# Patient Record
Sex: Male | Born: 1990 | Race: White | Hispanic: No | Marital: Single | Smoking: Never smoker
Health system: Southern US, Community
[De-identification: ages and names within clinical notes are randomized; demographics above are authoritative.]

## PROBLEM LIST (undated history)

## (undated) DIAGNOSIS — Z8619 Personal history of other infectious and parasitic diseases: Secondary | ICD-10-CM

## (undated) DIAGNOSIS — F32A Depression, unspecified: Secondary | ICD-10-CM

## (undated) DIAGNOSIS — A389 Scarlet fever, uncomplicated: Secondary | ICD-10-CM

## (undated) DIAGNOSIS — F419 Anxiety disorder, unspecified: Secondary | ICD-10-CM

## (undated) DIAGNOSIS — F845 Asperger's syndrome: Secondary | ICD-10-CM

## (undated) HISTORY — PX: HERNIA REPAIR: SHX51

---

## 2013-05-14 ENCOUNTER — Emergency Department (HOSPITAL_COMMUNITY): Payer: 59

## 2013-05-14 ENCOUNTER — Encounter (HOSPITAL_COMMUNITY): Payer: Self-pay | Admitting: Emergency Medicine

## 2013-05-14 ENCOUNTER — Emergency Department (HOSPITAL_COMMUNITY)
Admission: EM | Admit: 2013-05-14 | Discharge: 2013-05-14 | Disposition: A | Discharge status: Home / Self Care | Payer: 59 | Attending: Emergency Medicine | Admitting: Emergency Medicine

## 2013-05-14 DIAGNOSIS — F848 Other pervasive developmental disorders: Secondary | ICD-10-CM | POA: Insufficient documentation

## 2013-05-14 DIAGNOSIS — J3489 Other specified disorders of nose and nasal sinuses: Secondary | ICD-10-CM | POA: Insufficient documentation

## 2013-05-14 DIAGNOSIS — R040 Epistaxis: Secondary | ICD-10-CM | POA: Insufficient documentation

## 2013-05-14 DIAGNOSIS — S060X9A Concussion with loss of consciousness of unspecified duration, initial encounter: Secondary | ICD-10-CM | POA: Insufficient documentation

## 2013-05-14 DIAGNOSIS — Y9389 Activity, other specified: Secondary | ICD-10-CM | POA: Insufficient documentation

## 2013-05-14 DIAGNOSIS — Y92410 Unspecified street and highway as the place of occurrence of the external cause: Secondary | ICD-10-CM | POA: Insufficient documentation

## 2013-05-14 MED ORDER — IBUPROFEN 800 MG PO TABS: 800.0000 mg | ORAL_TABLET | Freq: Once | ORAL | Status: DC

## 2013-05-14 MED ORDER — IBUPROFEN 800 MG PO TABS
800.0000 mg | ORAL_TABLET | Freq: Once | ORAL | Status: AC
  Administered 2013-05-14: 800 mg via ORAL
  Filled 2013-05-14: qty 1
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
## 2013-05-14 NOTE — ED Notes (Signed)
Patient transported to X-ray 

## 2013-05-14 NOTE — ED Provider Notes (Signed)
Medical screening examination/treatment/procedure(s) were performed by non-physician practitioner and as supervising physician I was immediately available for consultation/collaboration.  Fernado Brigante R. Lorea Kupfer, MD 05/14/13 1540 

## 2013-12-04 ENCOUNTER — Encounter (HOSPITAL_COMMUNITY): Payer: Self-pay | Admitting: Emergency Medicine

## 2013-12-04 ENCOUNTER — Emergency Department (HOSPITAL_COMMUNITY): Payer: 59

## 2013-12-04 ENCOUNTER — Emergency Department (HOSPITAL_COMMUNITY)
Admission: EM | Admit: 2013-12-04 | Discharge: 2013-12-04 | Disposition: A | Discharge status: Home / Self Care | Payer: 59 | Attending: Emergency Medicine | Admitting: Emergency Medicine

## 2013-12-04 DIAGNOSIS — Y92410 Unspecified street and highway as the place of occurrence of the external cause: Secondary | ICD-10-CM | POA: Insufficient documentation

## 2013-12-04 DIAGNOSIS — R0789 Other chest pain: Secondary | ICD-10-CM

## 2013-12-04 DIAGNOSIS — Z8659 Personal history of other mental and behavioral disorders: Secondary | ICD-10-CM | POA: Insufficient documentation

## 2013-12-04 DIAGNOSIS — S335XXA Sprain of ligaments of lumbar spine, initial encounter: Secondary | ICD-10-CM | POA: Insufficient documentation

## 2013-12-04 DIAGNOSIS — Z8619 Personal history of other infectious and parasitic diseases: Secondary | ICD-10-CM | POA: Insufficient documentation

## 2013-12-04 DIAGNOSIS — S298XXA Other specified injuries of thorax, initial encounter: Secondary | ICD-10-CM | POA: Insufficient documentation

## 2013-12-04 DIAGNOSIS — Y9389 Activity, other specified: Secondary | ICD-10-CM | POA: Insufficient documentation

## 2013-12-04 DIAGNOSIS — S39012A Strain of muscle, fascia and tendon of lower back, initial encounter: Secondary | ICD-10-CM

## 2013-12-04 HISTORY — DX: Scarlet fever, uncomplicated: A38.9

## 2013-12-04 HISTORY — DX: Asperger's syndrome: F84.5

## 2013-12-04 MED ORDER — IBUPROFEN 800 MG PO TABS: 800.0000 mg | ORAL_TABLET | Freq: Three times a day (TID) | ORAL | Status: DC | PRN

## 2013-12-04 NOTE — ED Notes (Signed)
Pt states that "feels like a bubble that keeps popping in my chest."

## 2013-12-04 NOTE — ED Notes (Signed)
Pt.'s cleared off C-collar, removed.
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
## 2013-12-04 NOTE — Discharge Instructions (Signed)
Return here as needed.  Followup with his primary care Dr. for recheck.  Ice and heat on the areas that are sore

## 2013-12-04 NOTE — ED Notes (Signed)
Patient transported to X-ray 
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
## 2013-12-06 NOTE — ED Provider Notes (Signed)
Medical screening examination/treatment/procedure(s) were performed by non-physician practitioner and as supervising physician I was immediately available for consultation/collaboration.   Ericson Nafziger M Danyetta Gillham, MD 12/06/13 1334 
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
## 2014-11-22 ENCOUNTER — Encounter (HOSPITAL_COMMUNITY): Payer: Self-pay | Admitting: Emergency Medicine

## 2014-11-22 ENCOUNTER — Emergency Department (HOSPITAL_COMMUNITY)
Admission: EM | Admit: 2014-11-22 | Discharge: 2014-11-23 | Disposition: A | Discharge status: Home / Self Care | Payer: 59 | Attending: Emergency Medicine | Admitting: Emergency Medicine

## 2014-11-22 DIAGNOSIS — Z880 Allergy status to penicillin: Secondary | ICD-10-CM | POA: Insufficient documentation

## 2014-11-22 DIAGNOSIS — R45851 Suicidal ideations: Secondary | ICD-10-CM | POA: Insufficient documentation

## 2014-11-22 DIAGNOSIS — Z8619 Personal history of other infectious and parasitic diseases: Secondary | ICD-10-CM | POA: Insufficient documentation

## 2014-11-22 DIAGNOSIS — Z791 Long term (current) use of non-steroidal anti-inflammatories (NSAID): Secondary | ICD-10-CM | POA: Diagnosis not present

## 2014-11-22 DIAGNOSIS — K5900 Constipation, unspecified: Secondary | ICD-10-CM | POA: Insufficient documentation

## 2014-11-22 DIAGNOSIS — R000 Tachycardia, unspecified: Secondary | ICD-10-CM | POA: Insufficient documentation

## 2014-11-22 DIAGNOSIS — Z79899 Other long term (current) drug therapy: Secondary | ICD-10-CM | POA: Diagnosis not present

## 2014-11-22 LAB — ACETAMINOPHEN LEVEL

## 2014-11-22 LAB — CBC
HCT: 45 % (ref 39.0–52.0)
HEMOGLOBIN: 15.5 g/dL (ref 13.0–17.0)
MCH: 29.5 pg (ref 26.0–34.0)
MCHC: 34.4 g/dL (ref 30.0–36.0)
MCV: 85.6 fL (ref 78.0–100.0)
Platelets: 229 10*3/uL (ref 150–400)
RBC: 5.26 MIL/uL (ref 4.22–5.81)
RDW: 12.3 % (ref 11.5–15.5)
WBC: 12.7 10*3/uL — ABNORMAL HIGH (ref 4.0–10.5)

## 2014-11-22 LAB — COMPREHENSIVE METABOLIC PANEL
ALT: 20 U/L (ref 0–53)
AST: 31 U/L (ref 0–37)
Albumin: 5.2 g/dL (ref 3.5–5.2)
Alkaline Phosphatase: 66 U/L (ref 39–117)
Anion gap: 12 (ref 5–15)
BUN: 15 mg/dL (ref 6–23)
CALCIUM: 10.1 mg/dL (ref 8.4–10.5)
CO2: 22 mmol/L (ref 19–32)
Chloride: 102 mmol/L (ref 96–112)
Creatinine, Ser: 0.76 mg/dL (ref 0.50–1.35)
Glucose, Bld: 88 mg/dL (ref 70–99)
Potassium: 3.5 mmol/L (ref 3.5–5.1)
Sodium: 136 mmol/L (ref 135–145)
TOTAL PROTEIN: 7.9 g/dL (ref 6.0–8.3)
Total Bilirubin: 3.1 mg/dL — ABNORMAL HIGH (ref 0.3–1.2)

## 2014-11-22 LAB — ETHANOL: Alcohol, Ethyl (B): <5 mg/dL (ref 0–9)

## 2014-11-22 LAB — RAPID URINE DRUG SCREEN, HOSP PERFORMED
AMPHETAMINES: POSITIVE — AB
Barbiturates: NOT DETECTED
Benzodiazepines: NOT DETECTED
COCAINE: NOT DETECTED
Opiates: NOT DETECTED
Tetrahydrocannabinol: NOT DETECTED

## 2014-11-22 LAB — SALICYLATE LEVEL: Salicylate Lvl: <4 mg/dL (ref 2.8–20.0)

## 2014-11-22 MED ORDER — NICOTINE 21 MG/24HR TD PT24: 21.0000 mg | MEDICATED_PATCH | Freq: Every day | TRANSDERMAL | Status: DC

## 2014-11-22 MED ORDER — CITALOPRAM HYDROBROMIDE 10 MG PO TABS
10.0000 mg | ORAL_TABLET | Freq: Every day | ORAL | Status: DC
  Administered 2014-11-22: 10 mg via ORAL
  Filled 2014-11-22 (×2): qty 1

## 2014-11-22 MED ORDER — ZOLPIDEM TARTRATE 5 MG PO TABS: 5.0000 mg | ORAL_TABLET | Freq: Every evening | ORAL | Status: DC | PRN

## 2014-11-22 MED ORDER — IBUPROFEN 200 MG PO TABS: 600.0000 mg | ORAL_TABLET | Freq: Three times a day (TID) | ORAL | Status: DC | PRN

## 2014-11-22 MED ORDER — LISDEXAMFETAMINE DIMESYLATE 30 MG PO CAPS
30.0000 mg | ORAL_CAPSULE | Freq: Every day | ORAL | Status: DC
  Administered 2014-11-23: 30 mg via ORAL
  Filled 2014-11-22 (×2): qty 1

## 2014-11-22 MED ORDER — ONDANSETRON HCL 4 MG PO TABS: 4.0000 mg | ORAL_TABLET | Freq: Three times a day (TID) | ORAL | Status: DC | PRN

## 2014-11-22 MED ORDER — LORAZEPAM 1 MG PO TABS
1.0000 mg | ORAL_TABLET | Freq: Three times a day (TID) | ORAL | Status: DC | PRN
  Administered 2014-11-22: 1 mg via ORAL
  Filled 2014-11-22: qty 1

## 2014-11-22 MED ORDER — ACETAMINOPHEN 325 MG PO TABS: 650.0000 mg | ORAL_TABLET | ORAL | Status: DC | PRN

## 2014-11-22 MED ORDER — ALUM & MAG HYDROXIDE-SIMETH 200-200-20 MG/5ML PO SUSP: 30.0000 mL | ORAL | Status: DC | PRN

## 2014-11-22 NOTE — ED Notes (Signed)
Mother at bedside, pt not wanting to talk right now, pt resting in bed curled up in fetal position. Mother states pt recently lost job of 3 years and she states she thinks this is what triggered his thoughts and anxiety.

## 2014-11-22 NOTE — ED Notes (Signed)
Patient wanded by security. All belongings given to parents and taken to car.

## 2014-11-22 NOTE — ED Notes (Signed)
TTS at bedside w/ pt and parents, pt given sandwich to eat.
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
## 2014-11-22 NOTE — ED Notes (Signed)
Pt states that he feels like he wants to kill himself. Hx of aspberger's disorder. Lost his job recently. States he has several ways he has thought of killing himself but did not list any. Pt seems very anxious at present.

## 2014-11-22 NOTE — BH Assessment (Addendum)
Tele Assessment Note   Fernando BillsBrant Todd is an 24 y.o. male.  -Clinician reviewed note by Junius FinnerErin O'malley, PA regarding need for TTS.  Pt was seen today by his counselor Fernando Todd.  During the session patient wrote on a note that he was having thoughts of hurting himself.  Patient says that he has been thinking of hurting himself for a long time.  Pt has dx of Aspberger's Syndrome and does understand concept of ending one's life.  Patient says he is not sure he could be safe if he goes home.  No previous attempts.  Patient does not have a plan other than "I would go off somewhere and die."  Patient is unable to contract for safety at this time.  Patient started to see Dr. Ellis SavageLisa Todd for psychiatry in January.  Patient was recently put on citalopram at night for his mood.  Takes vivance in AM.  His workplace of 3 years had suggested medication to assist him with mood and regularity.  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  Patient also complains of stomach upset for last three days and he has not had regular bowel functions since then.    -Pt care reviewed with [name], PA who recommends that if patient cannot contract for safety, we need to look for placement.  [facility] does not have programming for persons w/ Asperger's Syndrome.  He suggested looking for other placement.  Patient care discussed with Dr. [name].    Axis I: Asperger's Disorder and Generalized Anxiety Disorder Axis II: Deferred Axis III:  Past Medical History  Diagnosis Date  . Asperger's disorder   . Scarlet fever    Axis IV: economic problems, occupational problems and other psychosocial or environmental problems Axis V: 31-40 impairment in reality testing  Past Medical History:  Past Medical History  Diagnosis Date  . Asperger's disorder   . Scarlet fever     Past Surgical History  Procedure Laterality Date  . Hernia repair      Family History: History reviewed. No  pertinent family history.  Social History:  reports that he has never smoked. He has never used smokeless tobacco. He reports that he does not drink alcohol or use illicit drugs.  Additional Social History:  Alcohol / Drug Use Pain Medications: See PTA medication list Prescriptions: Vyvanse, Citralopram Over the Counter: N/a History of alcohol / drug use?: No history of alcohol / drug abuse  CIWA: CIWA-Ar BP: 124/79 mmHg Pulse Rate: 104 COWS:    PATIENT STRENGTHS: (choose at least two) Communication skills Supportive family/friends Work skills  Allergies:  Allergies  Allergen Reactions  . Amoxicillin Hives    Home Medications:  (Not in a hospital admission)  OB/GYN Status:  No LMP for male patient.  General Assessment Data Location of Assessment: [facility] Is this a Tele or Face-to-Face Assessment?: Face-to-Face Is this an Initial Assessment or a Re-assessment for this encounter?: Initial Assessment Living Arrangements: Parent (Lives w/ parents and younger brother.) Can pt return to current living arrangement?: Yes Admission Status: Voluntary Is patient capable of signing voluntary admission?: Yes Transfer from: Acute Hospital Referral Source: Psychiatrist     [facility] Living Arrangements: Parent (Lives w/ parents and younger brother.) Name of Psychiatrist: Dr. Lance CoonPoulos  Name of Therapist: Vickie Conners w/ [facility]  Education Status Is patient currently in school?: No Highest grade of school patient has completed: 12th grade  Risk  to self with the past 6 months Suicidal Ideation: Yes-Currently Present Suicidal Intent: Yes-Currently Present (Pt responds, "Maybe" when asked if he may hurt himself.) Is patient at risk for suicide?: Yes Suicidal Plan?: No Access to Means: No What has been your use of drugs/alcohol within the last 12 months?: NOne Previous Attempts/Gestures: No How many times?: 0 Other Self Harm Risks: None Triggers for  Past Attempts: None known Intentional Self Injurious Behavior: None Family Suicide History: Yes (Maternal great grandfather) Recent stressful life event(s): Recent negative physical changes, Job Loss (Lost job 10 days ago.  Stomach upset for last 3 days  ) Persecutory voices/beliefs?: Yes Depression: Yes Depression Symptoms: Despondent, Tearfulness, Isolating, Loss of interest in usual pleasures, Feeling worthless/self pity Substance abuse history and/or treatment for substance abuse?: No Suicide prevention information given to non-admitted patients: Not applicable  Risk to Others within the past 6 months Homicidal Ideation: No Thoughts of Harm to Others: No Current Homicidal Intent: No Current Homicidal Plan: No Access to Homicidal Means: No Identified Victim: No one History of harm to others?: No Assessment of Violence: None Noted Violent Behavior Description: Pt calm and cooperative. Does patient have access to weapons?: No Criminal Charges Pending?: No Does patient have a court date: No  Psychosis Hallucinations: None noted Delusions: None noted  Mental Status Report Appear/Hygiene: Disheveled, In scrubs Eye Contact: Poor Motor Activity: Psychomotor retardation, Rigidity (Stiffness of movement in last 2 days.) Speech: Soft, Slow, Logical/coherent Level of Consciousness: Alert Mood: Depressed, Helpless, Sad, Despair Affect: Anxious, Depressed, Sad Anxiety Level: Severe Thought Processes: Coherent, Relevant Judgement: Unimpaired Orientation: Person, Place, Time, Appropriate for developmental age, Situation Obsessive Compulsive Thoughts/Behaviors: Moderate  Cognitive Functioning Memory: Recent Impaired, Remote Intact (Pt thinks his memory is getting worse.) IQ: Average (Pt has dx of Aspberger's Syndrome.) Impulse Control: Fair Appetite: Poor Weight Loss:  (Some stomach upset over last few days.) Weight Gain: 0 Sleep: No Change Total Hours of Sleep: 8 Vegetative  Symptoms: Staying in bed (Verbal reminders to attend to hygine needs.)  ADLScreening [facility]) Patient's cognitive ability adequate to safely complete daily activities?: Yes Patient able to express need for assistance with ADLs?: Yes Independently performs ADLs?: Yes (appropriate for developmental age)  Prior Inpatient Therapy Prior Inpatient Therapy: No Prior Therapy Dates: None Prior Therapy Facilty/Provider(s): None Reason for Treatment: None  Prior Outpatient Therapy Prior Outpatient Therapy: Yes Prior Therapy Dates: November '15 / January '16 Prior Therapy Facilty/Provider(s): Therapist / psychiatrist.  Reason for Treatment: Med management and counseling  ADL Screening (condition at time of admission) Patient's cognitive ability adequate to safely complete daily activities?: Yes Is the patient deaf or have difficulty hearing?: No Does the patient have difficulty seeing, even when wearing glasses/contacts?: No Does the patient have difficulty concentrating, remembering, or making decisions?: Yes Patient able to express need for assistance with ADLs?: Yes Does the patient have difficulty dressing or bathing?: Yes (Reminders for hygene.) Independently performs ADLs?: Yes (appropriate for developmental age) Does the patient have difficulty walking or climbing stairs?: No (Some difficulty in last 3-4 days.) Weakness of Legs: None Weakness of Arms/Hands: None       Abuse/Neglect Assessment (Assessment to be complete while patient is alone) Physical Abuse: Denies Verbal Abuse: Yes, past (Comment) (Middle & high school bullying.) Sexual Abuse: Denies Exploitation of patient/patient's resources: Denies Self-Neglect: Denies     Merchant navy officer (For Healthcare) Does patient have an advance directive?: No Would patient like information on creating an advanced directive?: No - patient declined  information    Additional Information 1:1 In Past 12 Months?:  No CIRT Risk: No Elopement Risk: No Does patient have medical clearance?: Yes     Disposition:  Disposition Initial Assessment Completed for this Encounter: Yes Disposition of Patient: Other dispositions Other disposition(s): Other (Comment) (Review with PA.)  Alexandria Lodge 11/22/2014 9:46 PM

## 2014-11-23 ENCOUNTER — Inpatient Hospital Stay (HOSPITAL_COMMUNITY): Admit: 2014-11-23 | Payer: Self-pay | Admitting: Psychiatry

## 2014-11-23 ENCOUNTER — Encounter (HOSPITAL_COMMUNITY): Payer: Self-pay | Admitting: *Deleted

## 2014-11-23 ENCOUNTER — Inpatient Hospital Stay (HOSPITAL_COMMUNITY)
Admission: AD | Admit: 2014-11-23 | Discharge: 2014-11-29 | DRG: 880 | Disposition: A | Discharge status: Home / Self Care | Payer: 59 | Source: Intra-hospital | Attending: Psychiatry | Admitting: Psychiatry

## 2014-11-23 DIAGNOSIS — F411 Generalized anxiety disorder: Principal | ICD-10-CM | POA: Diagnosis present

## 2014-11-23 DIAGNOSIS — F845 Asperger's syndrome: Secondary | ICD-10-CM

## 2014-11-23 DIAGNOSIS — R17 Unspecified jaundice: Secondary | ICD-10-CM | POA: Insufficient documentation

## 2014-11-23 DIAGNOSIS — R45851 Suicidal ideations: Secondary | ICD-10-CM | POA: Diagnosis not present

## 2014-11-23 DIAGNOSIS — F909 Attention-deficit hyperactivity disorder, unspecified type: Secondary | ICD-10-CM | POA: Diagnosis present

## 2014-11-23 DIAGNOSIS — Z79899 Other long term (current) drug therapy: Secondary | ICD-10-CM | POA: Diagnosis not present

## 2014-11-23 DIAGNOSIS — F908 Attention-deficit hyperactivity disorder, other type: Secondary | ICD-10-CM | POA: Insufficient documentation

## 2014-11-23 DIAGNOSIS — F329 Major depressive disorder, single episode, unspecified: Secondary | ICD-10-CM | POA: Diagnosis present

## 2014-11-23 DIAGNOSIS — F32A Depression, unspecified: Secondary | ICD-10-CM | POA: Diagnosis present

## 2014-11-23 DIAGNOSIS — F332 Major depressive disorder, recurrent severe without psychotic features: Secondary | ICD-10-CM | POA: Diagnosis present

## 2014-11-23 HISTORY — DX: Personal history of other infectious and parasitic diseases: Z86.19

## 2014-11-23 MED ORDER — MAGNESIUM HYDROXIDE 400 MG/5ML PO SUSP
30.0000 mL | Freq: Every day | ORAL | Status: DC | PRN
  Administered 2014-11-24: 30 mL via ORAL
  Filled 2014-11-23: qty 30

## 2014-11-23 MED ORDER — HYDROXYZINE HCL 25 MG PO TABS: 25.0000 mg | ORAL_TABLET | Freq: Four times a day (QID) | ORAL | Status: DC | PRN

## 2014-11-23 MED ORDER — TRAZODONE HCL 50 MG PO TABS
50.0000 mg | ORAL_TABLET | Freq: Every evening | ORAL | Status: DC | PRN
  Administered 2014-11-23 – 2014-11-25 (×3): 50 mg via ORAL
  Filled 2014-11-23 (×10): qty 1
  Filled 2014-11-23: qty 6
  Filled 2014-11-23 (×5): qty 1

## 2014-11-23 MED ORDER — ACETAMINOPHEN 325 MG PO TABS: 650.0000 mg | ORAL_TABLET | Freq: Four times a day (QID) | ORAL | Status: DC | PRN

## 2014-11-23 MED ORDER — CITALOPRAM HYDROBROMIDE 10 MG PO TABS
10.0000 mg | ORAL_TABLET | Freq: Every day | ORAL | Status: DC
  Administered 2014-11-23: 10 mg via ORAL
  Filled 2014-11-23 (×3): qty 1

## 2014-11-23 MED ORDER — ALUM & MAG HYDROXIDE-SIMETH 200-200-20 MG/5ML PO SUSP: 30.0000 mL | ORAL | Status: DC | PRN

## 2014-11-23 MED ORDER — LISDEXAMFETAMINE DIMESYLATE 30 MG PO CAPS
30.0000 mg | ORAL_CAPSULE | Freq: Every day | ORAL | Status: DC
  Administered 2014-11-24 – 2014-11-29 (×6): 30 mg via ORAL
  Filled 2014-11-23 (×6): qty 1

## 2014-11-23 NOTE — ED Notes (Signed)
Pt provided lunch meal. Pt sitting up in bed eating.

## 2014-11-23 NOTE — Consult Note (Signed)
[facility]to[facility] Consult   Reason for Consult:  Suicidal Ideation Referring Physician:  EDP Patient Identification: Fernando BillsBrant Bifulco MRN:  [id number] Principal Diagnosis: <principal problem not specified> Diagnosis:   Patient Active Problem List   Diagnosis Date Noted  . Suicidal ideation [R45.851]     Total Time spent with patient: 25 minutes  Subjective:   Fernando BillsBrant Ordoyne is a 24 y.o. male patient admitted with reports of suicidal ideation with rumination/obsession about self-harm. Pt seen and chart reviewed this AM by Dahlia ByesJosephine Onuoha, NP. Pt reported to meet inpatient criteria with placement pending.   HPI:  Fernando BillsBrant Hanssen is an 24 y.o. male.  -Clinician reviewed note by Junius FinnerErin O'malley, PA regarding need for TTS. Pt was seen today by his counselor [name]. During the session patient wrote on a note that he was having thoughts of hurting himself.  Patient says that he has been thinking of hurting himself for a long time. Pt has dx of Aspberger's Syndrome and does understand concept of ending one's life. Patient says he is not sure he could be safe if he goes home. No previous attempts. Patient does not have a plan other than "I would go off somewhere and die." Patient is unable to contract for safety at this time.  Patient started to see Dr. Ellis SavageLisa Poulos for psychiatry in January. Patient was recently put on citalopram at night for his mood. Takes vivance in AM. His workplace of 3 years had suggested medication to assist him with mood and regularity. 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 Patient also complains of stomach upset for last three days and he has not had regular bowel functions since then.   -Pt care reviewed with [name], PA who recommends that if patient cannot contract for safety, we need to look for placement. [facility] does not have programming for persons w/ Asperger's Syndrome. He suggested looking for  other placement. Patient care discussed with Dr. [name].  HPI Elements:   Location:  Psychiatric. Quality:  Worsening. Severity:  Severe. Timing:  Constant. Duration:  Persistent. 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  Past Medical History:  Past Medical History  Diagnosis Date  . Asperger's disorder   . Scarlet fever     Past Surgical History  Procedure Laterality Date  . Hernia repair     Family History: History reviewed. No pertinent family history. Social History:  History  Alcohol Use No     History  Drug Use No    History   Social History  . Marital Status: Single    Spouse Name: N/A  . Number of Children: N/A  . Years of Education: N/A   Social History Main Topics  . Smoking status: Never Smoker   . Smokeless tobacco: Never Used  . Alcohol Use: No  . Drug Use: No  . Sexual Activity: Not on file   Other Topics Concern  . Todd   Social History Narrative   Additional Social History:    Pain Medications: See PTA medication list Prescriptions: Vyvanse, Citralopram Over the Counter: N/a History of alcohol / drug use?: No history of alcohol / drug abuse                     Allergies:   Allergies  Allergen Reactions  . Amoxicillin Hives    Vitals: Blood pressure 117/77, pulse 91, temperature 99.2 F (37.3 C), temperature source Oral, resp.  rate 16, SpO2 96 %.  Risk to Self: Suicidal Ideation: Yes-Currently Present Suicidal Intent: Yes-Currently Present (Pt responds, "Maybe" when asked if he may hurt himself.) Is patient at risk for suicide?: Yes Suicidal Plan?: No Access to Means: No What has been your use of drugs/alcohol within the last 12 months?: Todd How many times?: 0 Other Self Harm Risks: Todd Triggers for Past Attempts: Todd known Intentional Self Injurious Behavior: Todd Risk to Others: Homicidal Ideation: No Thoughts of Harm to Others: No Current Homicidal Intent: No Current Homicidal Plan:  No Access to Homicidal Means: No Identified Victim: No one History of harm to others?: No Assessment of Violence: Todd Noted Violent Behavior Description: Pt calm and cooperative. Does patient have access to weapons?: No Criminal Charges Pending?: No Does patient have a court date: No Prior Inpatient Therapy: Prior Inpatient Therapy: No Prior Therapy Dates: Todd Prior Therapy Facilty/Provider(s): Todd Reason for Treatment: Todd Prior Outpatient Therapy: Prior Outpatient Therapy: Yes Prior Therapy Dates: November '15 / January '16 Prior Therapy Facilty/Provider(s): Therapist / psychiatrist.  Reason for Treatment: Med management and counseling  Current Facility-Administered Medications  Medication Dose Route Frequency Provider Last Rate Last Dose  . acetaminophen (TYLENOL) tablet 650 mg  650 mg Oral Q4H PRN Junius Finner, PA-C      . alum & mag hydroxide-simeth (MAALOX/MYLANTA) 200-200-20 MG/5ML suspension 30 mL  30 mL Oral PRN Junius Finner, PA-C      . citalopram (CELEXA) tablet 10 mg  10 mg Oral QHS Junius Finner, PA-C   10 mg at 11/22/14 2108  . ibuprofen (ADVIL,MOTRIN) tablet 600 mg  600 mg Oral Q8H PRN Junius Finner, PA-C      . lisdexamfetamine (VYVANSE) capsule 30 mg  30 mg Oral Daily Junius Finner, PA-C   30 mg at 11/23/14 1005  . LORazepam (ATIVAN) tablet 1 mg  1 mg Oral Q8H PRN Junius Finner, PA-C   1 mg at 11/22/14 2011  . nicotine (NICODERM CQ - dosed in mg/24 hours) patch 21 mg  21 mg Transdermal Daily Junius Finner, PA-C   21 mg at 11/22/14 2024  . ondansetron (ZOFRAN) tablet 4 mg  4 mg Oral Q8H PRN Junius Finner, PA-C      . zolpidem (AMBIEN) tablet 5 mg  5 mg Oral QHS PRN Junius Finner, PA-C       Current Outpatient Prescriptions  Medication Sig Dispense Refill  . citalopram (CELEXA) 10 MG tablet Take 10 mg by mouth at bedtime.  0  . VYVANSE 30 MG capsule Take 30 mg by mouth daily.  0  . ibuprofen (ADVIL,MOTRIN) 800 MG tablet Take 1 tablet (800 mg total) by mouth  every 8 (eight) hours as needed. (Patient not taking: Reported on 11/22/2014) 21 tablet 0    Musculoskeletal: Strength & Muscle Tone: within normal limits Gait & Station: normal Patient leans: N/A  Psychiatric Specialty Exam:     Blood pressure 117/77, pulse 91, temperature 99.2 F (37.3 C), temperature source Oral, resp. rate 16, SpO2 96 %.There is no height or weight on file to calculate BMI.  General Appearance: Casual  Eye Contact::  Poor  Speech:  Clear and Coherent  Volume:  Normal  Mood:  Depressed  Affect:  Depressed and Flat  Thought Process:  Circumstantial and Coherent  Orientation:  Full (Time, Place, and Person)  Thought Content:  Obsessions and Rumination  Suicidal Thoughts:  Yes.  with intent/plan  Homicidal Thoughts:  No  Memory:  Immediate;   Good Recent;  Good Remote;   Good  Judgement:  Fair  Insight:  Fair  Psychomotor Activity:  Normal  Concentration:  Good  Recall:  Fair  Fund of Knowledge:Good  Language: Good  Akathisia:  No  Handed:    AIMS (if indicated):     Assets:  Communication Skills Physical Health Resilience  ADL's:  Intact  Cognition: WNL  Sleep:      Medical Decision Making: Review of Psycho-Social Stressors (1), Review or order clinical lab tests (1) and Established Problem, Worsening (2)  Treatment Plan Summary: See below  Plan:  Recommend psychiatric Inpatient admission when medically cleared.  Disposition: Seek inpatient placement.   UPDATE:   Pt accepted to [facility] 403-2.   Beau Fanny, FNP-BC 11/23/2014 3:01 PM  Patient seen face-to-face for psychiatric evaluation, chart reviewed and case discussed with the physician extender and developed treatment plan. Reviewed the information documented and agree with the treatment plan. Thedore Mins, MD

## 2014-11-23 NOTE — Progress Notes (Signed)
Vol admit, 24 yo caucasian male, after being seen by his counselor today was referred to the ED d/t anxiety and suicidal thoughts.  He told her that he had been thinking about hurting himself for a long time.  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  He has Asperger's syndrome, but is high functioning.  He denies any major medical issues.  He denies any substance abuse issues.  He wants help to find the right medications to deal with his feelings and anxiety.  He was pleasant/cooperative with a pleasant sense of humor during the admission process.  He signed paperwork and was oriented to unit and room.  Safety checks q15 minutes initiated.

## 2014-11-23 NOTE — BH Assessment (Signed)
[facility] Assessment Progress Note  Per Thedore MinsMojeed Akintayo, MD this pt requires psychiatric hospitalization. Thurman CoyerEric Kaplan, RN, [facility] Rm 403-2. Pt has signed Voluntary Admission and Consent for Treatment, as well as Consent to Release Information, and signed [facility]. Pt's nurse, has been informed. She agrees to send original paperwork along with pt via Juel [place]row[place], and to call report to [phone].  Doylene Canninghomas Dainelle Hun, MA Triage Specialist 11/23/2014 @ 15:27

## 2014-11-23 NOTE — BH Assessment (Signed)
[facility] Dr. Jannifer FranklinAkintayo and Julieanne CottonJosephine, NP. Per AC-Eric Kaplin, patient bed assignment is 403-2. Nursing report # [phone].

## 2014-11-23 NOTE — Tx Team (Signed)
Initial Interdisciplinary Treatment Plan   PATIENT STRESSORS: Financial difficulties Health problems Occupational concerns Traumatic event   PATIENT STRENGTHS: Ability for insight Active sense of humor General fund of knowledge Motivation for treatment/growth Religious Affiliation Supportive family/friends   PROBLEM LIST: Problem List/Patient Goals Date to be addressed Date deferred Reason deferred Estimated date of resolution  Anxiety      Lost his job 10 days ago causing suicidal thoughts      Asperger's syndrome(high functioning)      Risk for self harm      "I lost my job because it was becoming too hard"      "I want to learn how to deal with my feelings"      "I need help to get more sleep at night"                   DISCHARGE CRITERIA:  Ability to meet basic life and health needs Adequate post-discharge living arrangements Improved stabilization in mood, thinking, and/or behavior Motivation to continue treatment in a less acute level of care Need for constant or close observation no longer present Verbal commitment to aftercare and medication compliance  PRELIMINARY DISCHARGE PLAN: Attend aftercare/continuing care group Outpatient therapy Return to previous living arrangement Return to previous work or school arrangements  PATIENT/FAMIILY INVOLVEMENT: This treatment plan has been presented to and reviewed with the patient, Fernando Todd, and/or family member.  The patient and family have been given the opportunity to ask questions and make suggestions.  Jesus GeneraSpeagle, Emit Kuenzel [facility] 11/23/2014, 10:42 PM

## 2014-11-23 NOTE — Progress Notes (Signed)
Patient ID: Fernando Todd, male   DOB: 1991-02-05, 24 y.o.   MRN: [id number] 24 yo who lost his job of 3 years who is depressed and SI.Marland Kitchen. Stated he has several ways to do it. Has a h/o Asperger (high function) and scarlet fever . He affect is flat poor to no eye contact will just stare. He reports being bullied through out school and sometimes something will remind him. He lost his job of 3 years  Two weeks ago. He denies SI  attempts now.  Denies drug use. Doesn't smoke or use alcohol. Has no tattoo's or scares on body He has a red rash on face.  Brought to unit and given  a meal .

## 2014-11-23 NOTE — ED Notes (Signed)
Pt's family member (sister) at bedside talking with pt.

## 2014-11-24 ENCOUNTER — Encounter (HOSPITAL_COMMUNITY): Payer: Self-pay | Admitting: Psychiatry

## 2014-11-24 DIAGNOSIS — F909 Attention-deficit hyperactivity disorder, unspecified type: Secondary | ICD-10-CM | POA: Diagnosis present

## 2014-11-24 DIAGNOSIS — F908 Attention-deficit hyperactivity disorder, other type: Secondary | ICD-10-CM

## 2014-11-24 DIAGNOSIS — F329 Major depressive disorder, single episode, unspecified: Secondary | ICD-10-CM | POA: Diagnosis present

## 2014-11-24 DIAGNOSIS — F845 Asperger's syndrome: Secondary | ICD-10-CM | POA: Diagnosis present

## 2014-11-24 DIAGNOSIS — F411 Generalized anxiety disorder: Secondary | ICD-10-CM | POA: Diagnosis present

## 2014-11-24 DIAGNOSIS — F32A Depression, unspecified: Secondary | ICD-10-CM | POA: Diagnosis present

## 2014-11-24 MED ORDER — CITALOPRAM HYDROBROMIDE 20 MG PO TABS
20.0000 mg | ORAL_TABLET | Freq: Every day | ORAL | Status: DC
  Administered 2014-11-24 – 2014-11-29 (×6): 20 mg via ORAL
  Filled 2014-11-24 (×9): qty 1

## 2014-11-24 MED ORDER — HYDROXYZINE HCL 25 MG PO TABS
25.0000 mg | ORAL_TABLET | Freq: Three times a day (TID) | ORAL | Status: DC
  Administered 2014-11-24 – 2014-11-29 (×16): 25 mg via ORAL
  Filled 2014-11-24 (×19): qty 1

## 2014-11-24 NOTE — [facility] Group Notes (Signed)
[facility] LCSW Group Therapy  Emotional Regulation 1:15 - 2: 30 PM        11/24/2014  3:10 PM    Type of Therapy:  Group Therapy  Participation Level:  Appropriate  Participation Quality:  Appropriate  Affect:  Flat, Depressed  Cognitive:  Attentive Appropriate  Insight:  Developing/Improving   Engagement in Therapy:  Developing/Improving   Modes of Intervention:  Discussion Exploration Problem-Solving Supportive  Summary of Progress/Problems:  Group topic was emotional regulations.  Patient participated in the discussion and was able to identify an emotion that needed to regulated.  He shared the emotion he has to regulate is fear and rejection.  Patient has difficulty expressing himself but often nodded in agreement with other patients.    Wynn BankerHodnett, Amalee Olsen Hairston 11/24/2014 3:10 PM

## 2014-11-24 NOTE — [facility] Suicide Risk Assessment (Signed)
[facility]   Nursing information obtained from:  Patient Demographic factors:  Male, Adolescent or young adult, Caucasian Current Mental Status:  NA (SI prior to admission) Loss Factors:  Decrease in vocational status Historical Factors:  Prior suicide attempts Risk Reduction Factors:  Sense of responsibility to family, Religious beliefs about death, Living with another person, especially a relative, Positive social support, Positive therapeutic relationship Total Time spent with patient: 30 minutes Principal Problem: Generalized anxiety disorder Diagnosis:   Patient Active Problem List   Diagnosis Date Noted  . Asperger's syndrome [F84.5] 11/24/2014  . ADHD (attention deficit hyperactivity disorder) [F90.9] 11/24/2014  . Generalized anxiety disorder [F41.1] 11/24/2014  . Depressive disorder [F32.9] 11/24/2014     Continued Clinical Symptoms:  Alcohol Use Disorder Identification Test Final Score (AUDIT): 0 The "Alcohol Use Disorders Identification Test", Guidelines for Use in Primary Care, Second Edition.  World Science writerHealth Organization [facility](WHO). Score between 0-7:  no or low risk or alcohol related problems. Score between 8-15:  moderate risk of alcohol related problems. Score between 16-19:  high risk of alcohol related problems. Score 20 or above:  warrants further diagnostic evaluation for alcohol dependence and treatment.   CLINICAL FACTORS:   Severe Anxiety and/or Agitation Depression:   Hopelessness Previous Psychiatric Diagnoses and Treatments   Musculoskeletal: Strength & Muscle Tone: within normal limits Gait & Station: normal Patient leans: N/A  Psychiatric Specialty Exam: Physical Exam  ROS  Blood pressure 122/71, pulse 88, temperature 97.6 F (36.4 C), temperature source Oral, resp. rate 20, height 6' (1.829 m), weight 56.7 kg (125 lb), SpO2 100 %.Body mass index is 16.95 kg/(m^2).  Please see H&P.     SUICIDE RISK:   Moderate:   Frequent suicidal ideation with limited intensity, and duration, some specificity in terms of plans, no associated intent, good self-control, limited dysphoria/symptomatology, some risk factors present, and identifiable protective factors, including available and accessible social support.  PLAN OF CARE: Please see H&P.   Medical Decision Making:  Review of Psycho-Social Stressors (1), Review or order clinical lab tests (1), Review and summation of old records (2), Established Problem, Worsening (2), Review of Medication Regimen & Side Effects (2) and Review of New Medication or Change in Dosage (2)  I certify that inpatient services furnished can reasonably be expected to improve the patient's condition.   Cristian Grieves md 11/24/2014, 3:27 PM

## 2014-11-24 NOTE — Progress Notes (Addendum)
Patient ID: Fernando Todd, male   DOB: 11/06/1990, 24 y.o.   MRN: [id number]  Pt currently presents with a flat affect and depressed behavior. Per self inventory, pt rates depression at a 3, hopelessness 0 and anxiety 3. Pt's daily goal is to "beginning my recovery" and they intend to do so by "follow schedule." Pt reports good sleep, concentration and appetite.   Pt provided with medications per providers orders. Pt's labs and vitals were monitored throughout the day. Pt supported emotionally and encouraged to express concerns and questions. Pt educated on medications and alternative stress management techniques.  Pt's safety ensured with 15 minute and environmental checks. Pt currently denies SI/HI and A/V hallucinations. Pt verbally agrees to seek staff if SI/HI or A/VH occurs and to consult with staff before acting on these thoughts. Pt avoids eye contact but has moments of intense staring at Clinical research associatewriter. Pt tells socially appropriate jokes, stating "well, bottoms up" while taking milk of magnesia for mild constipation.

## 2014-11-24 NOTE — [facility] Counselor (Signed)
Adult Comprehensive Assessment  Patient ID: Fernando Todd Carattini, male   DOB: June 26, 1991, 24 y.o.   MRN: [id number]  Information Source: Information source: Patient  Current Stressors:  Educational / Learning stressors: None Employment / Job issues: Patient lost his job two weeks ago Family Relationships: Parents are not getting along and this is causing a lot of discomfort in the home Financial / Lack of resources (include bankruptcy): None Housing / Lack of housing: None Physical health (include injuries & life threatening diseases): None Social relationships: None Substance abuse: None Bereavement / Loss: None  Living/Environment/Situation:  Living Arrangements: Parent Living conditions (as described by patient or guardian): Comfortable What is atmosphere in current home: Comfortable  Family History:  Marital status: Single Does patient have children?: No  Childhood History:  By whom was/is the patient raised?: Both parents Additional childhood history information: Good childhood until 7th grade when he was bullied by peers Description of patient's relationship with caregiver when they were a child: Good Patient's description of current relationship with people who raised him/her: Strained due to parents not getting along with each other Does patient have siblings?: Yes Number of Siblings: 2 Description of patient's current relationship with siblings: Good Did patient suffer any verbal/emotional/physical/sexual abuse as a child?: No Did patient suffer from severe childhood neglect?: No Has patient ever been sexually abused/assaulted/raped as an adolescent or adult?: No Was the patient ever a victim of a crime or a disaster?: No Witnessed domestic violence?: No Has patient been effected by domestic violence as an adult?: No  Education:  Currently a Consulting civil engineerstudent?: No  Employment/Work Situation:   Employment situation: On disability Why is patient on disability: Mental health How long  has patient been on disability: Several years Patient's job has been impacted by current illness: No What is the longest time patient has a held a job?: 15 Months Where was the patient employed at that time?: Designer, industrial/productWarehouse manager Has patient ever been in the [employer] and Companymilitary?: No Has patient ever served in Buyer, retailcombat?: No  Financial Resources:   Surveyor, quantityinancial resources: Writereceives SSI Does patient have a Lawyerrepresentative payee or guardian?: No  Alcohol/Substance Abuse:   What has been your use of drugs/alcohol within the last 12 months?: None If attempted suicide, did drugs/alcohol play a role in this?: No Alcohol/Substance Abuse Treatment Hx: Denies past history Has alcohol/substance abuse ever caused legal problems?: No  Social Support System:   Conservation officer, natureatient's Community Support System: Good Describe Community Support System: Patient reports being active in his church Type of faith/religion: Ephriam KnucklesChristian How does patient's faith help to cope with current illness?: Prayer, reading the Bible and church attendance  Leisure/Recreation:   Leisure and Hobbies: Singing  Strengths/Needs:   What things does the patient do well?: Patient reports having a strong spiritual faith In what areas does patient struggle / problems for patient: Fear and rejection  Discharge Plan:   Does patient have access to transportation?: Yes Will patient be returning to same living situation after discharge?: Yes Currently receiving community mental health services: Yes (From Whom) ([facility] and [facility]) If no, would patient like referral for services when discharged?: No Does patient have financial barriers related to discharge medications?: No  Summary/Recommendations:  Fernando Todd Mcmorris is a 24 years old Caucasian male admitted with Major Depression Disorder.  He will benefit from crisis stabilization, evaluation for medication, psycho-education groups for coping skills development, group therapy and case management for  discharge planning.     Naomii Kreger, Joesph JulyQuylle Hairston. 11/24/2014

## 2014-11-24 NOTE — H&P (Signed)
Psychiatric Admission Assessment Adult  Patient Identification: Fernando Todd MRN:  [id number] Date of Evaluation:  11/24/2014 Chief Complaint: Patient states "I had thoughts to kill myself.'    Principal Diagnosis: Generalized anxiety disorder  R/O MDD   Diagnosis:   Patient Active Problem List   Diagnosis Date Noted  . Asperger's syndrome [F84.5] 11/24/2014  . ADHD (attention deficit hyperactivity disorder) [F90.9] 11/24/2014  . Generalized anxiety disorder [F41.1] 11/24/2014  . Depressive disorder [F32.9] 11/24/2014          History of Present Illness::Patient is a 49 y old CM who is unemployed ,lives with his parents , has a previous hx of Asperger's as well as ADHD . Patient was recently started on Celexa for depression as well as anxiety sx. Patient started seeing Dr. Noemi Chapel for psychiatry in January. Patient presented with worsening depression as well as SI.  Patient with several psychosocial stressors like job loss 10 days ago. Patient reports being fired since he was not able to focus . 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 Patient also reports relational struggles between his parents with whom he lives and this is another stressor for him.  Patient throughout the evaluation today , appeared to be slow , with ?thought blocking as well as had poor eye contact (asperger's). Patient reports racing thoughts ,anxiety sx as well as lack of sleep. Patient at this time denies SI ,but did have SI on admission. Patient also reports anxiety attacks on and off when he feels like he has chest pain and racing heart rate .  Patient denies any previous hospitalizations in a mental health facility. Patient denies past suicide attempts. Patient denies substance abuse hx.   Patient reports a hx of being bullied as a child throughout his school days and having to live with whatever the after effects of bullying is "does not specify".   Elements:  Location:   anxiety,depression,SI on admission. Quality:  racing thoughts,inability to focus,sadness,anxiety attacks. Severity:  severe. Timing:  past few days. Duration:  past few days- worsening. Context:  hx of asperger's,ADHD. Associated Signs/Symptoms: Depression Symptoms:  depressed mood, insomnia, psychomotor retardation, difficulty concentrating, suicidal thoughts without plan, (Hypo) Manic Symptoms:  NA Anxiety Symptoms:  Excessive Worry, Psychotic Symptoms:  NA PTSD Symptoms: Had a traumatic exposure:  was bullied as a child - but cannot specify what sx he has , does have social anxiety at times. Total Time spent with patient: 1 hour  Past Medical History:  Past Medical History  Diagnosis Date  . Asperger's disorder   . Scarlet fever   . H/O scarlet fever     Past Surgical History  Procedure Laterality Date  . Hernia repair     Family History:  Family History  Problem Relation Age of Onset  . Alcohol abuse Other    Social History:  History  Alcohol Use No     History  Drug Use No    History   Social History  . Marital Status: Single    Spouse Name: N/A  . Number of Children: N/A  . Years of Education: N/A   Social History Main Topics  . Smoking status: Never Smoker   . Smokeless tobacco: Never Used  . Alcohol Use: No  . Drug Use: No  . Sexual Activity: No   Other Topics Concern  . None   Social History Narrative   Additional Social History:    Pain Medications: no c/o pain History of alcohol /  drug use?: No history of alcohol / drug abuse      Patient currently lives with his parents and younger brother. 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 Patient denies any legal issues. Patient is religious.Patient denies hx of sexual and physical abuse ,but does have a hx of being bullied as a child .               Musculoskeletal: Strength & Muscle Tone: within normal limits Gait & Station: normal Patient leans:  N/A  Psychiatric Specialty Exam: Physical Exam  Constitutional: He is oriented to person, place, and time. He appears well-developed.  HENT:  Head: Normocephalic.  Eyes: Pupils are equal, round, and reactive to light.  Neck: Normal range of motion.  Cardiovascular: Normal rate.   Respiratory: Effort normal.  GI: Soft.  Musculoskeletal: Normal range of motion.  Neurological: He is alert and oriented to person, place, and time.  Psychiatric: His speech is normal. Thought content normal. His mood appears anxious. He is slowed and withdrawn. Cognition and memory are normal. He expresses impulsivity. He exhibits a depressed mood.    Review of Systems  Constitutional: Negative.   HENT: Negative.   Eyes: Negative.   Respiratory: Negative.   Cardiovascular: Negative.   Genitourinary: Negative.   Musculoskeletal: Negative.   Skin: Negative.   Psychiatric/Behavioral: Positive for depression. The patient is nervous/anxious and has insomnia.     Blood pressure 122/71, pulse 88, temperature 97.6 F (36.4 C), temperature source Oral, resp. rate 20, height 6' (1.829 m), weight 56.7 kg (125 lb), SpO2 100 %.Body mass index is 16.95 kg/(m^2).  General Appearance: Disheveled  Eye Contact::  Poor  Speech:  Slow  Volume:  Decreased  Mood:  Anxious and Depressed  Affect:  Blunt  Thought Process:  Goal Directed  Orientation:  Full (Time, Place, and Person)  Thought Content:  Rumination  Suicidal Thoughts:  No, currently denies ,but on admission endorsed SI  Homicidal Thoughts:  No  Memory:  Immediate;   Fair Recent;   Fair Remote;   Fair  Judgement:  Impaired  Insight:  Fair  Psychomotor Activity:  Decreased  Concentration:  Poor  Recall:  [employer] of Knowledge:Fair  Language: Fair  Akathisia:  No  Handed:  Right  AIMS (if indicated):     Assets:  Communication Skills Desire for Improvement  ADL's:  Intact  Cognition: WNL  Sleep:  Number of Hours: 5.5   Risk to Self: Is patient  at risk for suicide?: Yes What has been your use of drugs/alcohol within the last 12 months?: None Risk to Others:  denies Prior Inpatient Therapy:  denies Prior Outpatient Therapy:  yes - see above  Alcohol Screening: 1. How often do you have a drink containing alcohol?: Never 9. Have you or someone else been injured as a result of your drinking?: No 10. Has a relative or friend or a doctor or another health worker been concerned about your drinking or suggested you cut down?: No Alcohol Use Disorder Identification Test Final Score (AUDIT): 0 Brief Intervention: AUDIT score less than 7 or less-screening does not suggest unhealthy drinking-brief intervention not indicated  Allergies:   Allergies  Allergen Reactions  . Amoxicillin Hives   Lab Results:  Results for orders placed or performed during the hospital encounter of 11/22/14 (from the past 48 hour(s))  Acetaminophen level     Status: Abnormal   Collection Time: 11/22/14  7:45 PM  Result  Value Ref Range   Acetaminophen (Tylenol), Serum <10.0 (L) 10 - 30 ug/mL    Comment:        THERAPEUTIC CONCENTRATIONS VARY SIGNIFICANTLY. A RANGE OF 10-30 ug/mL MAY BE AN EFFECTIVE CONCENTRATION FOR MANY PATIENTS. HOWEVER, SOME ARE BEST TREATED AT CONCENTRATIONS OUTSIDE THIS RANGE. ACETAMINOPHEN CONCENTRATIONS >150 ug/mL AT 4 HOURS AFTER INGESTION AND >50 ug/mL AT 12 HOURS AFTER INGESTION ARE OFTEN ASSOCIATED WITH TOXIC REACTIONS.   CBC     Status: Abnormal   Collection Time: 11/22/14  7:45 PM  Result Value Ref Range   WBC 12.7 (H) 4.0 - 10.5 K/uL   RBC 5.26 4.22 - 5.81 MIL/uL   Hemoglobin 15.5 13.0 - 17.0 g/dL   HCT 45.0 39.0 - 52.0 %   MCV 85.6 78.0 - 100.0 fL   MCH 29.5 26.0 - 34.0 pg   MCHC 34.4 30.0 - 36.0 g/dL   RDW 12.3 11.5 - 15.5 %   Platelets 229 150 - 400 K/uL  Comprehensive metabolic panel     Status: Abnormal   Collection Time: 11/22/14  7:45 PM  Result Value Ref Range   Sodium 136 135 - 145 mmol/L   Potassium  3.5 3.5 - 5.1 mmol/L   Chloride 102 96 - 112 mmol/L   CO2 22 19 - 32 mmol/L   Glucose, Bld 88 70 - 99 mg/dL   BUN 15 6 - 23 mg/dL   Creatinine, Ser 0.76 0.50 - 1.35 mg/dL   Calcium 10.1 8.4 - 10.5 mg/dL   Total Protein 7.9 6.0 - 8.3 g/dL   Albumin 5.2 3.5 - 5.2 g/dL   AST 31 0 - 37 U/L   ALT 20 0 - 53 U/L   Alkaline Phosphatase 66 39 - 117 U/L   Total Bilirubin 3.1 (H) 0.3 - 1.2 mg/dL   GFR calc non Af Amer >90 >90 mL/min   GFR calc Af Amer >90 >90 mL/min    Comment: (NOTE) The eGFR has been calculated using the CKD EPI equation. This calculation has not been validated in all clinical situations. eGFR's persistently <90 mL/min signify possible Chronic Kidney Disease.    Anion gap 12 5 - 15  Ethanol (ETOH)     Status: None   Collection Time: 11/22/14  7:45 PM  Result Value Ref Range   Alcohol, Ethyl (B) <5 0 - 9 mg/dL    Comment:        LOWEST DETECTABLE LIMIT FOR SERUM ALCOHOL IS 11 mg/dL FOR MEDICAL PURPOSES ONLY   Salicylate level     Status: None   Collection Time: 11/22/14  7:45 PM  Result Value Ref Range   Salicylate Lvl <5.0 2.8 - 20.0 mg/dL  Urine Drug Screen     Status: Abnormal   Collection Time: 11/22/14  7:58 PM  Result Value Ref Range   Opiates NONE DETECTED NONE DETECTED   Cocaine NONE DETECTED NONE DETECTED   Benzodiazepines NONE DETECTED NONE DETECTED   Amphetamines POSITIVE (A) NONE DETECTED   Tetrahydrocannabinol NONE DETECTED NONE DETECTED   Barbiturates NONE DETECTED NONE DETECTED    Comment:        DRUG SCREEN FOR MEDICAL PURPOSES ONLY.  IF CONFIRMATION IS NEEDED FOR ANY PURPOSE, NOTIFY LAB WITHIN 5 DAYS.        LOWEST DETECTABLE LIMITS FOR URINE DRUG SCREEN Drug Class       Cutoff (ng/mL) Amphetamine      1000 Barbiturate      200 Benzodiazepine   569 Tricyclics  300 Opiates          300 Cocaine          300 THC              50    Current Medications: Current Facility-Administered Medications  Medication Dose Route Frequency  Provider Last Rate Last Dose  . acetaminophen (TYLENOL) tablet 650 mg  650 mg Oral Q6H PRN Laverle Hobby, PA-C      . alum & mag hydroxide-simeth (MAALOX/MYLANTA) 200-200-20 MG/5ML suspension 30 mL  30 mL Oral Q4H PRN Laverle Hobby, PA-C      . citalopram (CELEXA) tablet 20 mg  20 mg Oral Daily Darcie Mellone, MD   20 mg at 11/24/14 1326  . hydrOXYzine (ATARAX/VISTARIL) tablet 25 mg  25 mg Oral TID Ursula Alert, MD   25 mg at 11/24/14 1326  . lisdexamfetamine (VYVANSE) capsule 30 mg  30 mg Oral Daily Laverle Hobby, PA-C   30 mg at 11/24/14 7096  . magnesium hydroxide (MILK OF MAGNESIA) suspension 30 mL  30 mL Oral Daily PRN Laverle Hobby, PA-C   30 mL at 11/24/14 0820  . traZODone (DESYREL) tablet 50 mg  50 mg Oral QHS,MR X 1 Laverle Hobby, PA-C   50 mg at 11/23/14 2232   PTA Medications: Prescriptions prior to admission  Medication Sig Dispense Refill Last Dose  . citalopram (CELEXA) 10 MG tablet Take 10 mg by mouth at bedtime.  0 11/21/2014 at Unknown time  . lidocaine (XYLOCAINE) 2 % solution   0   . meloxicam (MOBIC) 15 MG tablet   0   . VYVANSE 30 MG capsule Take 30 mg by mouth daily.  0 11/22/2014 at Unknown time    Previous Psychotropic Medications: Yes ,Zoloft(lack of efficacy)  Substance Abuse History in the last 12 months:  No.    Consequences of Substance Abuse: NA  Results for orders placed or performed during the hospital encounter of 11/22/14 (from the past 72 hour(s))  Acetaminophen level     Status: Abnormal   Collection Time: 11/22/14  7:45 PM  Result Value Ref Range   Acetaminophen (Tylenol), Serum <10.0 (L) 10 - 30 ug/mL    Comment:        THERAPEUTIC CONCENTRATIONS VARY SIGNIFICANTLY. A RANGE OF 10-30 ug/mL MAY BE AN EFFECTIVE CONCENTRATION FOR MANY PATIENTS. HOWEVER, SOME ARE BEST TREATED AT CONCENTRATIONS OUTSIDE THIS RANGE. ACETAMINOPHEN CONCENTRATIONS >150 ug/mL AT 4 HOURS AFTER INGESTION AND >50 ug/mL AT 12 HOURS AFTER INGESTION ARE OFTEN  ASSOCIATED WITH TOXIC REACTIONS.   CBC     Status: Abnormal   Collection Time: 11/22/14  7:45 PM  Result Value Ref Range   WBC 12.7 (H) 4.0 - 10.5 K/uL   RBC 5.26 4.22 - 5.81 MIL/uL   Hemoglobin 15.5 13.0 - 17.0 g/dL   HCT 45.0 39.0 - 52.0 %   MCV 85.6 78.0 - 100.0 fL   MCH 29.5 26.0 - 34.0 pg   MCHC 34.4 30.0 - 36.0 g/dL   RDW 12.3 11.5 - 15.5 %   Platelets 229 150 - 400 K/uL  Comprehensive metabolic panel     Status: Abnormal   Collection Time: 11/22/14  7:45 PM  Result Value Ref Range   Sodium 136 135 - 145 mmol/L   Potassium 3.5 3.5 - 5.1 mmol/L   Chloride 102 96 - 112 mmol/L   CO2 22 19 - 32 mmol/L   Glucose, Bld 88 70 - 99 mg/dL  BUN 15 6 - 23 mg/dL   Creatinine, Ser 0.76 0.50 - 1.35 mg/dL   Calcium 10.1 8.4 - 10.5 mg/dL   Total Protein 7.9 6.0 - 8.3 g/dL   Albumin 5.2 3.5 - 5.2 g/dL   AST 31 0 - 37 U/L   ALT 20 0 - 53 U/L   Alkaline Phosphatase 66 39 - 117 U/L   Total Bilirubin 3.1 (H) 0.3 - 1.2 mg/dL   GFR calc non Af Amer >90 >90 mL/min   GFR calc Af Amer >90 >90 mL/min    Comment: (NOTE) The eGFR has been calculated using the CKD EPI equation. This calculation has not been validated in all clinical situations. eGFR's persistently <90 mL/min signify possible Chronic Kidney Disease.    Anion gap 12 5 - 15  Ethanol (ETOH)     Status: None   Collection Time: 11/22/14  7:45 PM  Result Value Ref Range   Alcohol, Ethyl (B) <5 0 - 9 mg/dL    Comment:        LOWEST DETECTABLE LIMIT FOR SERUM ALCOHOL IS 11 mg/dL FOR MEDICAL PURPOSES ONLY   Salicylate level     Status: None   Collection Time: 11/22/14  7:45 PM  Result Value Ref Range   Salicylate Lvl <5.5 2.8 - 20.0 mg/dL  Urine Drug Screen     Status: Abnormal   Collection Time: 11/22/14  7:58 PM  Result Value Ref Range   Opiates NONE DETECTED NONE DETECTED   Cocaine NONE DETECTED NONE DETECTED   Benzodiazepines NONE DETECTED NONE DETECTED   Amphetamines POSITIVE (A) NONE DETECTED   Tetrahydrocannabinol  NONE DETECTED NONE DETECTED   Barbiturates NONE DETECTED NONE DETECTED    Comment:        DRUG SCREEN FOR MEDICAL PURPOSES ONLY.  IF CONFIRMATION IS NEEDED FOR ANY PURPOSE, NOTIFY LAB WITHIN 5 DAYS.        LOWEST DETECTABLE LIMITS FOR URINE DRUG SCREEN Drug Class       Cutoff (ng/mL) Amphetamine      1000 Barbiturate      200 Benzodiazepine   732 Tricyclics       202 Opiates          300 Cocaine          300 THC              50     Observation Level/Precautions:  15 minute checks  Laboratory:  TSH if not already done  Psychotherapy:  Individual and group  Medications:  As needed  Consultations: as needed   Discharge Concerns:stability and safety         Psychological Evaluations: No   Treatment Plan Summary: Daily contact with patient to assess and evaluate symptoms and progress in treatment and Medication management Patient will benefit from inpatient treatment and stabilization.  Estimated length of stay is 5-7 days.  Reviewed past medical records,treatment plan.  Will continue Vyvanse as prescribed by outpatient provider. Patient does not want any changes with dosage. Will increase Celexa to 20 mg po daily for affective sx. Will start Vistaril 25 mg po tid for anxiety sx. Will continue to monitor vitals ,medication compliance and treatment side effects while patient is here.  Will monitor for medical issues as well as call consult as needed.  Reviewed labs ,will order as needed.  CSW will start working on disposition.  Patient to participate in therapeutic milieu .  Medical Decision Making:  Review of Psycho-Social Stressors (1), Review or order clinical lab tests (1), Established Problem, Worsening (2), Review of Medication Regimen & Side Effects (2) and Review of New Medication or Change in Dosage (2)  I certify that inpatient services furnished can reasonably be expected to improve the patient's condition.   Quintin Hjort MD 2/24/20163:46  PM

## 2014-11-24 NOTE — Progress Notes (Signed)
Patient endorsed a good day. Sat in day room interacting with peers at the beginning of the shift. Mood and affect appropriate. He stated ; " I got started on a new stuff (medication) today and hope it will help him. He denied SI/HI and denied Hallucinations. Writer encouraged and supported patient. Administered HS medication as ordered. Q 15 minute checks continues as ordered to maintain safety.

## 2014-11-24 NOTE — [facility] Group Notes (Signed)
[facility]   11/24/2014 10:22 AM    Participation Quality:  Appropraite  Mood/Affect:  Appropriate  Depression Rating:  3  Anxiety Rating:  3  Thoughts of Suicide:  No  Will you contract for safety?   NA  Current AVH:  No  Plan for Discharge/Comments:  Patient attended discharge planning group and actively participated in group. Patient reports feeling better today.  He advised of having outpatient services with [facility].  Patient reports having home and transportation. Suicide prevention education reviewed and SPE document provided.   Transportation Means: Patient has transportation.   Supports:  Patient has a support system.   Cloa Bushong, Joesph JulyQuylle Hairston

## 2014-11-25 DIAGNOSIS — R17 Unspecified jaundice: Secondary | ICD-10-CM

## 2014-11-25 LAB — HEPATIC FUNCTION PANEL
ALT: 15 U/L (ref 0–53)
AST: 24 U/L (ref 0–37)
Albumin: 4.8 g/dL (ref 3.5–5.2)
Alkaline Phosphatase: 75 U/L (ref 39–117)
BILIRUBIN DIRECT: 0.2 mg/dL (ref 0.0–0.5)
Indirect Bilirubin: 0.7 mg/dL (ref 0.3–0.9)
Total Bilirubin: 0.9 mg/dL (ref 0.3–1.2)
Total Protein: 7.5 g/dL (ref 6.0–8.3)

## 2014-11-25 LAB — TSH: TSH: 1.29 u[IU]/mL (ref 0.350–4.500)

## 2014-11-25 LAB — COMPREHENSIVE METABOLIC PANEL
ALBUMIN: 4.7 g/dL (ref 3.5–5.2)
ALT: 17 U/L (ref 0–53)
ANION GAP: 6 (ref 5–15)
AST: 20 U/L (ref 0–37)
Alkaline Phosphatase: 62 U/L (ref 39–117)
BUN: 14 mg/dL (ref 6–23)
CO2: 29 mmol/L (ref 19–32)
CREATININE: 0.88 mg/dL (ref 0.50–1.35)
Calcium: 9.4 mg/dL (ref 8.4–10.5)
Chloride: 104 mmol/L (ref 96–112)
GFR calc Af Amer: >90 mL/min (ref >90)
GFR calc non Af Amer: >90 mL/min (ref >90)
Glucose, Bld: 90 mg/dL (ref 70–99)
Potassium: 4.2 mmol/L (ref 3.5–5.1)
SODIUM: 139 mmol/L (ref 135–145)
Total Bilirubin: 1.5 mg/dL — ABNORMAL HIGH (ref 0.3–1.2)
Total Protein: 7.4 g/dL (ref 6.0–8.3)

## 2014-11-25 NOTE — Progress Notes (Signed)
Called today with IM input/question about patient's elevated bilirubin. Labs/chart reviewed and discussed with Aurea GraffSheila May Augustin, NP. This is a 24 yo M admitted with worsening depression/SI. He has isolated hyperbilirubinemia without other LFT abnormalities and is completely asymptomatic from GI standpoint.  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 Thank you.  Wilburta Milbourn M. Elvera LennoxGherghe, MD [facility] [phone]

## 2014-11-25 NOTE — [facility] Group Notes (Signed)
[facility] LCSW Group Therapy  [facility] 1:15 - 2:30 PM  11/25/2014   Type of Therapy:  Group Therapy  Participation Level: Active  Participation Quality:  Attentive  Affect:  Appropriate  Cognitive:  Appropriate  Insight:  Developing/Improving   Engagement in Therapy:  Developing/Improving   Modes of Intervention:  Discussion, Education, Exploration, Problem-Solving, Rapport Building, Support   Summary of Progress/Problems:   Patient was attentive to speaker from the [facility] as he shared his story of dealing with mental health/substance abuse issues and overcoming it by working a recovery program. Patient nodded in agreement to speaker when he was talking about being bullying and how negative words can have an impact.  Patient expressed interest in their programs and services and received information on their agency.    Fernando Todd, Fernando Todd 11/25/2014

## 2014-11-25 NOTE — [facility] Suicide Risk Assessment (Signed)
[facility] INPATIENT:  Family/Significant Other Suicide Prevention Education  Suicide Prevention Education:  Education Completed; Newton PiggJohn Mall, Father, [phone];  has been identified by the patient as the family member/significant other with whom the patient will be residing, and identified as the person(s) who will aid the patient in the event of a mental health crisis (suicidal ideations/suicide attempt).  With written consent from the patient, the family member/significant other has been provided the following suicide prevention education, prior to the and/or following the discharge of the patient.  The suicide prevention education provided includes the following:  Suicide risk factors  Suicide prevention and interventions  National Suicide Hotline telephone number  [facility] telephone number  [facility] 911  [facility] and/or [facility] telephone number  Request made of family/significant other to:  Remove weapons (e.g., guns, rifles, knives), all items previously/currently identified as safety concern.   Father advised patient does not have access to weapons.    Remove drugs/medications (over-the-counter, prescriptions, illicit drugs), all items previously/currently identified as a safety concern.  The family member/significant other verbalizes understanding of the suicide prevention education information provided.  The family member/significant other agrees to remove the items of safety concern listed above.  Wynn BankerHodnett, Kathye Cipriani Hairston 11/25/2014, 8:39 AM

## 2014-11-25 NOTE — Progress Notes (Signed)
D:Pt rates his depression and anxiety as a 3 on 1-10 scale with 10 being the most. Pt reports that he is feeling less depressed compared to how he felt before coming to the hospital. He states that it was the first time that he had ever had suicide thoughts and did not go into detail the stressors associated with those thoughts. A:Supported pt to discuss feelings. Offered encouragement and 15 minute checks. R:Pt denies si and hi. Safety maintained on the unit.

## 2014-11-25 NOTE — Progress Notes (Signed)
Went to group 

## 2014-11-25 NOTE — Progress Notes (Signed)
Adult Psychoeducational Group Note  Date:  11/25/2014 Time:  2:17 PM  Group Topic/Focus:  Overcoming Stress:   The focus of this group is to define stress and help patients assess their triggers.  Participation Level:  Active  Participation Quality:  Appropriate  Affect:  Appropriate  Cognitive:  Appropriate  Insight: Appropriate  Engagement in Group:  Engaged  Modes of Intervention: Discussion  Additional Comments:  Pt attended the group and remained appropriate and engaged throughout the duration of the group. Pt shared that singing is his best stress reliever. Pt appeared interested and vested in the group.   Sheran Lawlesseese, Avleen Bordwell O 11/25/2014, 2:17 PM

## 2014-11-25 NOTE — Progress Notes (Signed)
[facility] MD Progress Note  11/25/2014 2:15 PM Fernando Todd  MRN:  [id number] Subjective: Patient states "I am OK,I slept well.' Objective:Patient seen and chart reviewed. Patient discussed with treatment team. Patient today continues to be flat, slowed, minimal eye contact. Patient reports medications as working .Reports his racing thoughts are the same, hard to tell any improvement at this time.  Patient denies SI/HI/AH/VH. Denies any SE of medications.    Principal Problem: Generalized anxiety disorder Diagnosis:   Primary Psychiatric Diagnosis: Generalized anxiety disorder   Secondary Psychiatric Diagnosis: Asperger's syndrome ADHD  Non Psychiatric Diagnosis: SEE PMH Patient Active Problem List   Diagnosis Date Noted  . Asperger's syndrome [F84.5] 11/24/2014  . ADHD (attention deficit hyperactivity disorder) [F90.9] 11/24/2014  . Generalized anxiety disorder [F41.1] 11/24/2014  . Depressive disorder [F32.9] 11/24/2014   Total Time spent with patient: 30 minutes   Past Medical History:  Past Medical History  Diagnosis Date  . Asperger's disorder   . Scarlet fever   . H/O scarlet fever     Past Surgical History  Procedure Laterality Date  . Hernia repair     Family History:  Family History  Problem Relation Age of Onset  . Alcohol abuse Other    Social History:  History  Alcohol Use No     History  Drug Use No    History   Social History  . Marital Status: Single    Spouse Name: N/A  . Number of Children: N/A  . Years of Education: N/A   Social History Main Topics  . Smoking status: Never Smoker   . Smokeless tobacco: Never Used  . Alcohol Use: No  . Drug Use: No  . Sexual Activity: No   Other Topics Concern  . None   Social History Narrative   Additional History:    Sleep: Fair  Appetite:  Fair    Musculoskeletal: Strength & Muscle Tone: within normal limits Gait & Station: normal Patient leans: N/A   Psychiatric Specialty  Exam: Physical Exam  Review of Systems  Psychiatric/Behavioral: Positive for depression. The patient is nervous/anxious.     Blood pressure 123/66, pulse 88, temperature 98.2 F (36.8 C), temperature source Oral, resp. rate 18, height 6' (1.829 m), weight 56.7 kg (125 lb), SpO2 100 %.Body mass index is 16.95 kg/(m^2).  General Appearance: Fairly Groomed  Engineer, water::  Fair  Speech:  Slow  Volume:  Normal  Mood:  Anxious and Depressed  Affect:  Appropriate  Thought Process:  Linear and but response is very slow.  Orientation:  Full (Time, Place, and Person)  Thought Content:  Rumination  Suicidal Thoughts:  No  Homicidal Thoughts:  No  Memory:  Immediate;   Fair Recent;   Fair Remote;   Fair  Judgement:  Fair  Insight:  Fair  Psychomotor Activity:  Decreased  Concentration:  Poor  Recall:  [employer] of Knowledge:Fair  Language: Fair  Akathisia:  No  Handed:  Right  AIMS (if indicated):     Assets:  Social Support  ADL's:  Intact  Cognition: WNL  Sleep:  Number of Hours: 5.5     Current Medications: Current Facility-Administered Medications  Medication Dose Route Frequency Provider Last Rate Last Dose  . acetaminophen (TYLENOL) tablet 650 mg  650 mg Oral Q6H PRN Laverle Hobby, PA-C      . alum & mag hydroxide-simeth (MAALOX/MYLANTA) 200-200-20 MG/5ML suspension 30 mL  30 mL Oral Q4H PRN Laverle Hobby, PA-C      .  citalopram (CELEXA) tablet 20 mg  20 mg Oral Daily Ted Goodner, MD   20 mg at 11/25/14 0842  . hydrOXYzine (ATARAX/VISTARIL) tablet 25 mg  25 mg Oral TID Ursula Alert, MD   25 mg at 11/25/14 1158  . lisdexamfetamine (VYVANSE) capsule 30 mg  30 mg Oral Daily Laverle Hobby, PA-C   30 mg at 11/25/14 2952  . magnesium hydroxide (MILK OF MAGNESIA) suspension 30 mL  30 mL Oral Daily PRN Laverle Hobby, PA-C   30 mL at 11/24/14 0820  . traZODone (DESYREL) tablet 50 mg  50 mg Oral QHS,MR X 1 Laverle Hobby, PA-C   50 mg at 11/24/14 2141    Lab Results:   Results for orders placed or performed during the hospital encounter of 11/23/14 (from the past 48 hour(s))  TSH     Status: None   Collection Time: 11/25/14  7:00 AM  Result Value Ref Range   TSH 1.290 0.350 - 4.500 uIU/mL    Comment: Performed at [facility]  Comprehensive metabolic panel     Status: Abnormal   Collection Time: 11/25/14  7:00 AM  Result Value Ref Range   Sodium 139 135 - 145 mmol/L   Potassium 4.2 3.5 - 5.1 mmol/L   Chloride 104 96 - 112 mmol/L   CO2 29 19 - 32 mmol/L   Glucose, Bld 90 70 - 99 mg/dL   BUN 14 6 - 23 mg/dL   Creatinine, Ser 0.88 0.50 - 1.35 mg/dL   Calcium 9.4 8.4 - 10.5 mg/dL   Total Protein 7.4 6.0 - 8.3 g/dL   Albumin 4.7 3.5 - 5.2 g/dL   AST 20 0 - 37 U/L   ALT 17 0 - 53 U/L   Alkaline Phosphatase 62 39 - 117 U/L   Total Bilirubin 1.5 (H) 0.3 - 1.2 mg/dL   GFR calc non Af Amer >90 >90 mL/min   GFR calc Af Amer >90 >90 mL/min    Comment: (NOTE) The eGFR has been calculated using the CKD EPI equation. This calculation has not been validated in all clinical situations. eGFR's persistently <90 mL/min signify possible Chronic Kidney Disease.    Anion gap 6 5 - 15    Comment: Performed at [facility]    Physical Findings: AIMS: Facial and Oral Movements Muscles of Facial Expression: None, normal Lips and Perioral Area: None, normal Jaw: None, normal Tongue: None, normal,Extremity Movements Upper (arms, wrists, hands, fingers): None, normal Lower (legs, knees, ankles, toes): None, normal, Trunk Movements Neck, shoulders, hips: None, normal, Overall Severity Severity of abnormal movements (highest score from questions above): None, normal Incapacitation due to abnormal movements: None, normal Patient's awareness of abnormal movements (rate only patient's report): No Awareness, Dental Status Current problems with teeth and/or dentures?: No Does patient usually wear dentures?: No  CIWA:  CIWA-Ar Total: 0 COWS:      Assessment: Patient with anxiety,depression ADHD as well as Asperger's presents after losing his job 10 days ago. Patient had recent medication changes at his outpatient psychiatrist - would prefer to stay on the same. But will increase the dose .    Treatment Plan Summary: Daily contact with patient to assess and evaluate symptoms and progress in treatment and Medication management Estimated length of stay is 5-7 days.  Reviewed past medical records,treatment plan.  Will continue Vyvanse as prescribed by outpatient provider. Patient does not want any changes with dosage. Will continue Celexa  20 mg po daily  for affective sx. Will continue Vistaril 25 mg po tid for anxiety sx. Will continue to monitor vitals ,medication compliance and treatment side effects while patient is here.  Will monitor for medical issues as well as call consult as needed.  Reviewed labs ,Abnormal CMP - bilirubin was elevated - will consult hospitalist for recommendations. CSW will start working on disposition.  Patient to participate in therapeutic milieu .     Medical Decision Making:  Review of Psycho-Social Stressors (1), Review or order clinical lab tests (1), Review or order medicine tests (1), Review of Medication Regimen & Side Effects (2) and Review of New Medication or Change in Dosage (2)     Yobani Schertzer MD 11/25/2014, 2:15 PM

## 2014-11-25 NOTE — Progress Notes (Signed)
Dicussed elevated bilirubin with Dr Lafe GarinGherge.  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  He did note that the level had improved.  Ordered hep panel and LFT's in the am to rule out any liver involvement.  He denies hx of drug abuse.

## 2014-11-25 NOTE — Progress Notes (Signed)
Recreation Therapy Notes  Animal-Assisted Activity/Therapy (AAA/T) Program Checklist/Progress Notes Patient Eligibility Criteria Checklist & Daily Group note for Rec Tx Intervention  Date:  02.25.2016 Time: 2:45pm Location: [address] Dayroom    AAA/T Program Assumption of Risk Form signed by Patient/ or Parent Legal Guardian yes  Patient is free of allergies or sever asthma yes  Patient reports no fear of animals yes  Patient reports no history of cruelty to animals yes  Patient understands his/her participation is voluntary yes  Patient washes hands before animal contact yes  Patient washes hands after animal contact yes  Behavioral Response: Appropriate   Education: Hand Washing, Appropriate Animal Interaction   Education Outcome: Acknowledges education.   Clinical Observations/Feedback: Patient appropriately engaged with therapy dog, handler and peers during session.   Hope Brandenburger L Braylynn Ghan, LRT/CTRS  Camri Molloy L 11/25/2014 4:27 PM 

## 2014-11-26 LAB — HEPATITIS PANEL, ACUTE
HCV Ab: NEGATIVE
HEP B C IGM: NONREACTIVE
Hep A IgM: NONREACTIVE
Hepatitis B Surface Ag: NEGATIVE

## 2014-11-26 NOTE — Progress Notes (Signed)
Recreation Therapy Notes  02.26.2016 @ approximately 12:00pm. Duration: 15 minutes  LRT met with patient to educate and provide instruction on diaphragmatic breathing and progressive muscle relaxation. Patient receptive to both techniques, practicing both without issue. Patient displayed ability to practice diaphragmatic breathing independently. Patient expressed he would be able to practice progressive muscle relaxation independently post d/c. Patient affect softened during 1:1 and his speech became slurred, as his body became more relaxed. Patient identified he could use these techniques daily to help him reduce his stress level. Patient requested a few minutes at end of session to compose himself before going to lunch with unit, LRT honored patient request and informed patient RN he was in his room.   Fernando Todd L. Amsi Grimley, LRT/CTRS  Fernando Todd 11/26/2014 12:23 PM

## 2014-11-26 NOTE — Progress Notes (Signed)
[facility] MD Progress Note  11/26/2014 12:01 PM Fernando Todd  MRN:  [id number] Subjective: Patient states "I am OK.' Objective:Patient seen and chart reviewed. Patient discussed with treatment team. Patient does not communicate a lot at baseline , answers questions in monosyllables. Has a diagnosis of Asperger's as well as ADHD which could be limiting his social skills.  Patient reports improvement in his racing thoughts as well as anxiety sx and reports that he had a good day yesterday during pet therapy.  Patient denies SI/HI/AH/VH. Denies any SE of medications.    Principal Problem: Generalized anxiety disorder Diagnosis:   Primary Psychiatric Diagnosis: Generalized anxiety disorder   Secondary Psychiatric Diagnosis: Asperger's syndrome ADHD  Non Psychiatric Diagnosis: SEE PMH Patient Active Problem List   Diagnosis Date Noted  . Elevated bilirubin [R17]   . Asperger's syndrome [F84.5] 11/24/2014  . ADHD (attention deficit hyperactivity disorder) [F90.9] 11/24/2014  . Generalized anxiety disorder [F41.1] 11/24/2014  . Depressive disorder [F32.9] 11/24/2014   Total Time spent with patient: 30 minutes   Past Medical History:  Past Medical History  Diagnosis Date  . Asperger's disorder   . Scarlet fever   . H/O scarlet fever     Past Surgical History  Procedure Laterality Date  . Hernia repair     Family History:  Family History  Problem Relation Age of Onset  . Alcohol abuse Other    Social History:  History  Alcohol Use No     History  Drug Use No    History   Social History  . Marital Status: Single    Spouse Name: N/A  . Number of Children: N/A  . Years of Education: N/A   Social History Main Topics  . Smoking status: Never Smoker   . Smokeless tobacco: Never Used  . Alcohol Use: No  . Drug Use: No  . Sexual Activity: No   Other Topics Concern  . None   Social History Narrative   Additional History:    Sleep: Fair  Appetite:   Fair    Musculoskeletal: Strength & Muscle Tone: within normal limits Gait & Station: normal Patient leans: N/A   Psychiatric Specialty Exam: Physical Exam  ROS  Blood pressure 118/75, pulse 88, temperature 97.6 F (36.4 C), temperature source Oral, resp. rate 18, height 6' (1.829 m), weight 56.7 kg (125 lb), SpO2 100 %.Body mass index is 16.95 kg/(m^2).  General Appearance: Fairly Groomed  Engineer, water::  Fair  Speech:  Slow, patient takes a while to answer questions , but answers appropriately   Volume:  Normal  Mood:  Anxious and Depressed improving  Affect:  Appropriate  Thought Process:  Linear and but response is very slow.  Orientation:  Full (Time, Place, and Person)  Thought Content:  Rumination  Suicidal Thoughts:  No  Homicidal Thoughts:  No  Memory:  Immediate;   Fair Recent;   Fair Remote;   Fair  Judgement:  Fair  Insight:  Fair  Psychomotor Activity:  Decreased  Concentration:  Fair  Recall:  [employer] of Knowledge:Fair  Language: Fair  Akathisia:  No  Handed:  Right  AIMS (if indicated):     Assets:  Social Support  ADL's:  Intact  Cognition: WNL  Sleep:  Number of Hours: 5.5     Current Medications: Current Facility-Administered Medications  Medication Dose Route Frequency Provider Last Rate Last Dose  . acetaminophen (TYLENOL) tablet 650 mg  650 mg Oral Q6H PRN Laverle Hobby, PA-C      .  alum & mag hydroxide-simeth (MAALOX/MYLANTA) 200-200-20 MG/5ML suspension 30 mL  30 mL Oral Q4H PRN Laverle Hobby, PA-C      . citalopram (CELEXA) tablet 20 mg  20 mg Oral Daily Eden Rho, MD   20 mg at 11/26/14 0834  . hydrOXYzine (ATARAX/VISTARIL) tablet 25 mg  25 mg Oral TID Ursula Alert, MD   25 mg at 11/26/14 1153  . lisdexamfetamine (VYVANSE) capsule 30 mg  30 mg Oral Daily Laverle Hobby, PA-C   30 mg at 11/26/14 4944  . magnesium hydroxide (MILK OF MAGNESIA) suspension 30 mL  30 mL Oral Daily PRN Laverle Hobby, PA-C   30 mL at 11/24/14 0820   . traZODone (DESYREL) tablet 50 mg  50 mg Oral QHS,MR X 1 Laverle Hobby, PA-C   50 mg at 11/25/14 2202    Lab Results:  Results for orders placed or performed during the hospital encounter of 11/23/14 (from the past 48 hour(s))  TSH     Status: None   Collection Time: 11/25/14  7:00 AM  Result Value Ref Range   TSH 1.290 0.350 - 4.500 uIU/mL    Comment: Performed at [facility]  Comprehensive metabolic panel     Status: Abnormal   Collection Time: 11/25/14  7:00 AM  Result Value Ref Range   Sodium 139 135 - 145 mmol/L   Potassium 4.2 3.5 - 5.1 mmol/L   Chloride 104 96 - 112 mmol/L   CO2 29 19 - 32 mmol/L   Glucose, Bld 90 70 - 99 mg/dL   BUN 14 6 - 23 mg/dL   Creatinine, Ser 0.88 0.50 - 1.35 mg/dL   Calcium 9.4 8.4 - 10.5 mg/dL   Total Protein 7.4 6.0 - 8.3 g/dL   Albumin 4.7 3.5 - 5.2 g/dL   AST 20 0 - 37 U/L   ALT 17 0 - 53 U/L   Alkaline Phosphatase 62 39 - 117 U/L   Total Bilirubin 1.5 (H) 0.3 - 1.2 mg/dL   GFR calc non Af Amer >90 >90 mL/min   GFR calc Af Amer >90 >90 mL/min    Comment: (NOTE) The eGFR has been calculated using the CKD EPI equation. This calculation has not been validated in all clinical situations. eGFR's persistently <90 mL/min signify possible Chronic Kidney Disease.    Anion gap 6 5 - 15    Comment: Performed at [facility]  Hepatic function panel     Status: None   Collection Time: 11/25/14  7:38 PM  Result Value Ref Range   Total Protein 7.5 6.0 - 8.3 g/dL   Albumin 4.8 3.5 - 5.2 g/dL   AST 24 0 - 37 U/L   ALT 15 0 - 53 U/L   Alkaline Phosphatase 75 39 - 117 U/L   Total Bilirubin 0.9 0.3 - 1.2 mg/dL   Bilirubin, Direct 0.2 0.0 - 0.5 mg/dL   Indirect Bilirubin 0.7 0.3 - 0.9 mg/dL    Comment: Performed at [facility]    Physical Findings: AIMS: Facial and Oral Movements Muscles of Facial Expression: None, normal Lips and Perioral Area: None, normal Jaw: None, normal Tongue: None,  normal,Extremity Movements Upper (arms, wrists, hands, fingers): None, normal Lower (legs, knees, ankles, toes): None, normal, Trunk Movements Neck, shoulders, hips: None, normal, Overall Severity Severity of abnormal movements (highest score from questions above): None, normal Incapacitation due to abnormal movements: None, normal Patient's awareness of abnormal movements (rate only patient's report):  No Awareness, Dental Status Current problems with teeth and/or dentures?: No Does patient usually wear dentures?: No  CIWA:  CIWA-Ar Total: 0 COWS:     Assessment: Patient with anxiety,depression ADHD as well as Asperger's presents after losing his job 10 days ago. Patient continues to improve.    Treatment Plan Summary: Daily contact with patient to assess and evaluate symptoms and progress in treatment and Medication management Will continue Vyvanse as prescribed by outpatient provider. Patient does not want any changes with dosage. Will continue Celexa  20 mg po daily for affective sx. Will continue Vistaril 25 mg po tid for anxiety sx. Will continue to monitor vitals ,medication compliance and treatment side effects while patient is here.  Will monitor for medical issues as well as call consult as needed.  Reviewed labs ,Abnormal CMP - bilirubin was elevated - pls see  hospitalist recommendations in EHR. RT consult placed - please consult note. CSW will start working on disposition.  Patient to participate in therapeutic milieu .     Medical Decision Making:  Review of Psycho-Social Stressors (1), Review or order clinical lab tests (1), Review or order medicine tests (1), Review of Medication Regimen & Side Effects (2) and Review of New Medication or Change in Dosage (2)     Owyn Raulston MD 11/26/2014, 12:01 PM

## 2014-11-26 NOTE — Progress Notes (Signed)
Patient ID: Charline BillsBrant Coomer, male   DOB: 1990-10-04, 24 y.o.   MRN: [id number] D: client visible on the unit, seen in dayroom dancing, interacts appropriately with staff and peers, some trembles noted. Client c/o "feeling like a zombie" this morning d/t trazodone. Reports day has been "really good, but I didn't like (a male client) leaving today". Client report goal "to go to all the groups" He attended all but one group. A: Writer provided emotional support encouraged him to focus on his recovery. Staff will monitor [id number] for safety. R: Client is safe on the unit. Staff with Georganna SkeansIjauma NP about night medications, noted that Vistaril may also cause drowsiness, no changes made, client refused trazodone.

## 2014-11-26 NOTE — Progress Notes (Signed)
Patient ID: [name], male   DOB: 1991/09/12, 24 y.o.   MRN: [id number]  Pt currently presents with a flat affect and depressed behavior. Per self inventory, pt rates depression at a 3, hopelessness 3 and anxiety 3. Pt's daily goal is "attending groups" and they intend to do so by "attend groups." Pt reports good sleep, concentration and appetite.   Pt provided with medications per providers orders. Pt's labs and vitals were monitored throughout the day. Pt supported emotionally and encouraged to express concerns and questions. Pt educated on medications and positive support systems.  Pt's safety ensured with 15 minute and environmental checks. Pt currently denies SI/HI and A/V hallucinations. Pt verbally agrees to seek staff if SI/HI or A/VH occurs and to consult with staff before acting on these thoughts. Pt seen "beatboxing" and laughing with other pts today in the hallway.

## 2014-11-26 NOTE — [facility] Group Notes (Signed)
[facility]   11/26/2014 3:40 PM    Participation Quality:  Appropraite  Mood/Affect:  Appropriate  Depression Rating:  3  Anxiety Rating:  3  Thoughts of Suicide:  No  Will you contract for safety?   NA  Current AVH:  No  Plan for Discharge/Comments:  Patient attended discharge planning group and actively participated in group. He reports doing well and hopes to discharge home soon.  Patient will follow up with [facility] and Vickie Conner.  Suicide prevention education reviewed and SPE document provided.   Transportation Means: Patient has transportation.   Supports:  Patient has a support system.   Wynn BankerHodnett, Amye Grego Hairston   11/26/2014   3:40 PM

## 2014-11-26 NOTE — Tx Team (Signed)
Interdisciplinary Treatment Plan Update   Date Reviewed:  11/26/2014  Time Reviewed:  3:37 PM  Progress in Treatment:   Attending groups: Yes, patient attends groups. Participating in groups: Yes, patient engages in group discussions. Taking medication as prescribed: Yes  Tolerating medication: Yes Family/Significant other contact made:  Yes, collateral contact with father. Patient understands diagnosis: Yes, patient understands diagnosis and need for treatment. Discussing patient identified problems/goals with staff: Yes, patient is able to express goals for treatment and discharge. Medical problems stabilized or resolved: Yes Denies suicidal/homicidal ideation: Yes Patient has not harmed self or others: Yes  For review of initial/current patient goals, please see plan of care.  Estimated Length of Stay:  3-4 days  Reasons for Continued Hospitalization:  Anxiety Depression Medication stabilization   New Problems/Goals identified:    Discharge Plan or Barriers:   Home with outpatient follow up with [facility] and Vickie Conner  Additional Comments: Continue medication stabilization  Patient and CSW reviewed patient's identified goals and treatment plan.  Patient verbalized understanding and agreed to treatment plan.   Attendees:  Patient:  11/26/2014 3:37 PM   Signature:  Sallyanne HaversF. Cobos, MD 11/26/2014 3:37 PM  Signature: Geoffery LyonsIrving Lugo, MD 11/26/2014 3:37 PM  Signature:  Kathi SimpersSarah Twyman, RN 11/26/2014 3:37 PM  Signature:  Scot JunJennifer Pritchard,  RN 11/26/2014 3:37 PM  Signature:   11/26/2014 3:37 PM  Signature:  Juline PatchQuylle Ziara Thelander, LCSW 11/26/2014 3:37 PM  Signature:  Samuella BruinKristin Drinkard, LCSW-A 11/26/2014 3:37 PM  Signature:  Leisa LenzValerie Enoch, Care Coordinator [facility] 11/26/2014 3:37 PM  Signature:  Rodman KeyJanet Webb, RN 11/26/2014 3:37 PM   11/26/2014  3:37 PM   11/26/2014  3:37 PM   11/26/2014  3:37 PM    Scribe for Treatment Team:   Juline PatchQuylle Aashka Salomone,  11/26/2014 3:37 PM

## 2014-11-26 NOTE — Progress Notes (Signed)
Recreation Therapy Notes  INPATIENT RECREATION THERAPY ASSESSMENT  Patient Details Name: Fernando BillsBrant Galeana MRN: [id number] DOB: 05-24-1991 Today's Date: 11/26/2014   Patient slow and deliberate in his speech, was observed to shake his head in methodical fashion during assessment and had limited eye contact with LRT. Patient vague with details shared during assessment, often providing minimal details and making it difficult for LRT to grasp breath of patient difficulties prior to admission. Patient did report "The past year has been one thing after the other" but was unable to provide clear timeline of events for LRT.   Patient Stressors: Family, Relationship, Work   Patient reports there is a lot of "drama" in his home, patient unable to provide specific examples or clearly define drama for LRT.  Patient reports he recently went through a break-up of approximately a 2 year relationship, patient reports he kept this relationship a secret from anyone in the family. Patient was unable or unwilling to identify reason for keeping this relationship secret and made sure to express to LRT he did not want his family to know anything about him engaging in this relationship.   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)  Personal Challenges: Relationships, Stress Management, Work Nutritional therapisterformance  Leisure Interests (2+):  Sports - Exercise (Comment), Individual - Other (Comment) (Running, Singing)  Awareness of Community Resources:  Yes  Community Resources:  [employer], Other (Comment) ([place]ils)  Current Use: Yes  Patient Strengths:  "I'm a very loving person." "I have a strong faith."  Patient Identified Areas of  Improvement:  Nothing  Current Recreation Participation:  Running, Singing  Patient Goal for Hospitalization:  "I want to feel like I learned as much as possible to be able to handle myself after I go home." Patient expressed a desire to extend his LOS at this time, stating he feels he would benefit from additional hospitalization.  [place]  County of Residence:  [place]   Current [place] (including self-harm):  No  Current HI:  No  Consent to Intern Participation: N/A   Jearl KlinefelterDenise L Kurk Corniel, LRT/CTRS 11/26/2014, 10:12 AM

## 2014-11-26 NOTE — Plan of Care (Signed)
Problem: Diagnosis: Increased Risk For Suicide Attempt Goal: LTG-Patient Will Show Positive Response to Medication LTG (by discharge) : Patient will show positive response to medication and will participate in the development of the discharge plan.  Outcome: Completed/Met Date Met:  11/26/14 Pt reports "I think the medicines are helping me, I feel better"

## 2014-11-26 NOTE — Progress Notes (Signed)
D: client is visible on the unit, interacts appropriately with staff and peers. Client appears to be in a daze at times, stare is intense at times. A: Clinical research associateWriter provided emotional support, encourages karaoke. Client to reports any side effects or discomfort. Staff will monitor q315min for safety. R: Client is safe on the unit, attended karaoke, participated by singing.

## 2014-11-26 NOTE — [facility] Group Notes (Signed)
[facility] Group Notes:  (Nursing/MHT/Case Management/Adjunct)  Date:  11/26/2014  Time:  1000  Type of Therapy:  Psychoeducational Skills  Participation Level:  Active  Participation Quality:  Appropriate  Affect:  Appropriate  Cognitive:  Appropriate  Insight:  Appropriate  Engagement in Group:  Engaged  Modes of Intervention:  Activity and Discussion  Summary of Progress/Problems:  [name], Frank Novelo L 11/26/2014, 12:25 PM

## 2014-11-26 NOTE — [facility] Group Notes (Signed)
[facility] LCSW Group Therapy  Feelings Around Relapse 1:15 -2:30        11/26/2014   Type of Therapy:  Group Therapy  Participation Level:  Appropriate  Participation Quality:  Appropriate  Affect:  Appropriate  Cognitive:  Attentive Appropriate  Insight:  Developing/Improving  Engagement in Therapy: Developing/Improving  Modes of Intervention:  Discussion Exploration Problem-Solving Supportive  Summary of Progress/Problems:  The topic for today was feelings around relapse.  Patient processed feelings toward relapse and was able to relate to peers.   He shared relapsing for him would be questioning himself and his beliefs.  Patient shared he has a lot of friend who are religious and they tell him if he his faith were stronger, he would not have some of the struggles he is experiencing.  CSW reminded patient of people in the Bible who went through struggles.  Patient was able to understand that having faith does not mean we will not have struggles.     Wynn BankerHodnett, Tameisha Covell Hairston 11/26/2014

## 2014-11-27 DIAGNOSIS — F909 Attention-deficit hyperactivity disorder, unspecified type: Secondary | ICD-10-CM

## 2014-11-27 NOTE — Progress Notes (Signed)
D.  Pt pleasant on approach, sitting in dayroom interacting with peers.  No complaints voiced.  Denies need for sleep medication tonight.  Positive for evening wrap up group.  Denies SI/HI/hallucinations at this time.  A.  Support and encouragement offered  R.  Pt remains safe on unit, will continue to monitor.

## 2014-11-27 NOTE — [facility] Group Notes (Signed)
[facility] Group Notes:  (Clinical Social Work)  11/27/2014   1:15-2:15PM  Summary of Progress/Problems:   The main focus of today's process group was for the patient to identify ways in which they have sabotaged their own mental health wellness/recovery.  Motivational interviewing and a handout were used to explore the benefits and costs of their self-sabotaging behavior as well as the benefits and costs of changing this behavior.  The Stages of Change were explained to the group using a handout, and patients identified where they are with regard to changing self-defeating behaviors.  The patient expressed he self-sabotages with isolation.  When another patient said how much he has helped them during their hospital stay, he appeared to be very pleased and complimented.  Type of Therapy:  Process Group  Participation Level:  Active  Participation Quality:  Attentive  Affect:  Blunted  Cognitive:  Appropriate and Oriented  Insight:  Developing/Improving  Engagement in Therapy:  Engaged  Modes of Intervention:  Education, Motivational Interviewing   Ambrose MantleMareida Grossman-Orr, LCSW 11/27/2014, 4:00pm

## 2014-11-27 NOTE — Progress Notes (Signed)
[facility]  11/27/2014 3:31 PM Fernando Todd  MRN:  [id number] Subjective:  Flat affect and slow response    Patient states "I am OK.'  With a little encouragement and creating trusting space patient opened up and shared some pieces of himself  Tells me how significant his church and religion are to him.  He loves to sing "if people could just see inside of me when I'm signing! "  Conversed about his love of animals and time he spent volunteering at [employer]  He feels misunderstood, " I have Asperger's" and recognizes he needs to "get himself together"     Rates depression 0 /10  Anxiety 0 /10   Sleep and appetite are fair  Patient denies SI/HI/AH/VH.  Denies any SE of medications.      Principal Problem: Generalized anxiety disorder Diagnosis:   Primary Psychiatric Diagnosis: Generalized anxiety disorder   Secondary Psychiatric Diagnosis: Asperger's syndrome ADHD  Non Psychiatric Diagnosis: SEE PMH Patient Active Problem List   Diagnosis Date Noted  . Elevated bilirubin [R17]   . Asperger's syndrome [F84.5] 11/24/2014  . ADHD (attention deficit hyperactivity disorder) [F90.9] 11/24/2014  . Generalized anxiety disorder [F41.1] 11/24/2014  . Depressive disorder [F32.9] 11/24/2014   Total Time spent with patient: 30 minutes   Past Medical History:  Past Medical History  Diagnosis Date  . Asperger's disorder   . Scarlet fever   . H/O scarlet fever     Past Surgical History  Procedure Laterality Date  . Hernia repair     Family History:  Family History  Problem Relation Age of Onset  . Alcohol abuse Other    Social History:  History  Alcohol Use No     History  Drug Use No    History   Social History  . Marital Status: Single    Spouse Name: N/A  . Number of Children: N/A  . Years of Education: N/A   Social History Main Topics  . Smoking status: Never Smoker   . Smokeless tobacco: Never Used  . Alcohol Use: No  . Drug Use:  No  . Sexual Activity: No   Other Topics Concern  . None   Social History Narrative   Additional History:    Sleep: good Appetite: good   Musculoskeletal: Strength & Muscle Tone: within normal limits Gait & Station: normal Patient leans: N/A   Psychiatric Specialty Exam: Physical Exam  Constitutional: He is oriented to person, place, and time. He appears well-developed and well-nourished.  HENT:  Head: Normocephalic and atraumatic.  Neck: Normal range of motion. Neck supple.  Musculoskeletal: Normal range of motion.  Neurological: He is alert and oriented to person, place, and time.  Skin: Skin is warm and dry.    ROS  Blood pressure 138/79, pulse 85, temperature 98 F (36.7 C), temperature source Oral, resp. rate 19, height 6' (1.829 m), weight 56.7 kg (125 lb), SpO2 100 %.Body mass index is 16.95 kg/(m^2).  General Appearance: casual  Eye Contact::  good  Speech:  Slow, patient takes a while to answer questions , but answers appropriately   Volume:  Normal  Mood:  Flat and depressed (may be baseline)  Affect:  Appropriate  Thought Process:  Linear and but response is very slow.  Orientation:  Full (Time, Place, and Person)  Thought Content:  Goal directed  Suicidal Thoughts:  No  Homicidal Thoughts:  No  Memory:  Immediate;   Fair Recent;   Fair Remote;  Fair  Judgement:  Fair  Insight:  Fair  Psychomotor Activity:  Decreased  Concentration:  Fair  Recall:  [employer] of Knowledge:Fair  Language: Fair  Akathisia:  No  Handed:  Right  AIMS (if indicated):     Assets:  Social Support  ADL's:  Intact  Cognition: WNL  Sleep:  Number of Hours: 5.5     Current Medications: Current Facility-Administered Medications  Medication Dose Route Frequency Provider Last Rate Last Dose  . acetaminophen (TYLENOL) tablet 650 mg  650 mg Oral Q6H PRN Kerry Hough, PA-C      . alum & mag hydroxide-simeth (MAALOX/MYLANTA) 200-200-20 MG/5ML suspension 30 mL  30 mL  Oral Q4H PRN Kerry Hough, PA-C      . citalopram (CELEXA) tablet 20 mg  20 mg Oral Daily Jomarie Longs, MD   20 mg at 11/27/14 0844  . hydrOXYzine (ATARAX/VISTARIL) tablet 25 mg  25 mg Oral TID Jomarie Longs, MD   25 mg at 11/27/14 1206  . lisdexamfetamine (VYVANSE) capsule 30 mg  30 mg Oral Daily Kerry Hough, PA-C   30 mg at 11/27/14 0844  . magnesium hydroxide (MILK OF MAGNESIA) suspension 30 mL  30 mL Oral Daily PRN Kerry Hough, PA-C   30 mL at 11/24/14 0820  . traZODone (DESYREL) tablet 50 mg  50 mg Oral QHS,MR X 1 Kerry Hough, PA-C   50 mg at 11/25/14 2202    Lab Results:  Results for orders placed or performed during the hospital encounter of 11/23/14 (from the past 48 hour(s))  Hepatic function panel     Status: None   Collection Time: 11/25/14  7:38 PM  Result Value Ref Range   Total Protein 7.5 6.0 - 8.3 g/dL   Albumin 4.8 3.5 - 5.2 g/dL   AST 24 0 - 37 U/L   ALT 15 0 - 53 U/L   Alkaline Phosphatase 75 39 - 117 U/L   Total Bilirubin 0.9 0.3 - 1.2 mg/dL   Bilirubin, Direct 0.2 0.0 - 0.5 mg/dL   Indirect Bilirubin 0.7 0.3 - 0.9 mg/dL    Comment: Performed at [facility]  Hepatitis panel, acute     Status: None   Collection Time: 11/26/14  7:00 AM  Result Value Ref Range   Hepatitis B Surface Ag NEGATIVE NEGATIVE   HCV Ab NEGATIVE NEGATIVE   Hep A IgM NON REACTIVE NON REACTIVE    Comment: (NOTE) Effective August 16, 2014, Hepatitis Acute Panel (test code 16109) will be revised to automatically reflex to the Hepatitis C Viral RNA, Quantitative, Real-Time PCR assay if the Hepatitis C antibody screening result is Reactive. This action is being taken to ensure that the CDC/USPSTF recommended HCV diagnostic algorithm with the appropriate test reflex needed for accurate interpretation is followed.    Hep B C IgM NON REACTIVE NON REACTIVE    Comment: (NOTE) High levels of Hepatitis B Core IgM antibody are detectable during the acute stage  of Hepatitis B. This antibody is used to differentiate current from past HBV infection. Performed at [facility]     Physical Findings: AIMS: Facial and Oral Movements Muscles of Facial Expression: None, normal Lips and Perioral Area: None, normal Jaw: None, normal Tongue: None, normal,Extremity Movements Upper (arms, wrists, hands, fingers): None, normal Lower (legs, knees, ankles, toes): None, normal, Trunk Movements Neck, shoulders, hips: None, normal, Overall Severity Severity of abnormal movements (highest score from questions above): None, normal Incapacitation  due to abnormal movements: None, normal Patient's awareness of abnormal movements (rate only patient's report): No Awareness, Dental Status Current problems with teeth and/or dentures?: No Does patient usually wear dentures?: No  CIWA:  CIWA-Ar Total: 0 COWS:     Assessment: Patient with anxiety,depression ADHD as well as Asperger's presents after losing his job 10 days ago. Patient continues to improve.    Treatment Plan Summary: Daily contact with patient to assess and evaluate symptoms and progress in treatment and Medication management Will continue Vyvanse as prescribed by outpatient provider. Patient does not want any changes with dosage. Will continue Celexa  20 mg po daily  Will continue Vistaril 25 mg po tid  Will continue to monitor vitals ,medication compliance and treatment side effects while patient is here. Denies side effects Will monitor for medical issues as well as call consult as needed.   Abnormal CMP - bilirubin was elevated - -denies  abdominal pain or tenderness CSW will start working on disposition.  Patient to participating in groups.     Medical Decision Making:  Review of Psycho-Social Stressors (1), Review or order clinical lab tests (1), Review or order medicine tests (1), Review of Medication Regimen & Side Effects (2) and Review of New Medication or Change in Dosage  (2)  -denies side effects   Fernando Todd, Fernando Todd  PMHNP 11/27/2014, 3:31 PM  I have personally seen the patient and agreed with the findings and involved in the treatment plan. Kathryne Sharper, MD

## 2014-11-27 NOTE — Progress Notes (Signed)
D) Pt has been attending the program and interacting with his peers. Participation in group and with his peers has been positive for him. Pt is concrete in his approach with interaction. Rates his depression at a 3, hopelessness at a 0 and anxiety at a 3. Denies SI and HI. A) Given support, reassurance and praise. Provided with a 1:1. R) Pt denies SI and HI.

## 2014-11-27 NOTE — Progress Notes (Signed)
.  Psychoeducational Group Note    Date: 11/27/2014 Time: 0930    Goal Setting Purpose of Group: To be able to set a goal that is measurable and that can be accomplished in one day Participation Level:  Active  Participation Quality:  Appropriate  Affect:  Appropriate  Cognitive:  Oriented  Insight:  Improving  Engagement in Group:  Engaged  Additional Comments:  Pt participating and adding to the group.   Alexavier Tsutsui A  

## 2014-11-27 NOTE — Progress Notes (Signed)
.  Psychoeducational Group Note    Date: 11/27/2014 Time: 0930    Goal Setting Purpose of Group: To be able to set a goal that is measurable and that can be accomplished in one day Participation Level:  Active  Participation Quality:  Appropriate  Affect:  Appropriate  Cognitive:  Oriented  Insight:  Improving  Engagement in Group:  Engaged  Additional Comments:  Pt participating and adding to the group.   Leba Tibbitts A  

## 2014-11-28 NOTE — Progress Notes (Signed)
[facility] MD Progress Note  11/28/2014 2:20 PM Fernando Todd  MRN:  [id number] Subjective:  Attending groups, reports they are "very helpful", reports being friends with almost everyone here.  This is very different for him having always felt bullied.  Rates depression 0 /10  Anxiety 0 /10      Sleep and appetite are good  Patient denies SI/HI/AH/VH.  Denies any SE of medications.  Plan on discharge is to take time for himself, continue with counselor Vicky Conners and "get himself together"      Principal Problem: Generalized anxiety disorder Diagnosis:   Primary Psychiatric Diagnosis: Generalized anxiety disorder   Secondary Psychiatric Diagnosis: Asperger's syndrome ADHD  Non Psychiatric Diagnosis: SEE PMH Patient Active Problem List   Diagnosis Date Noted  . Elevated bilirubin [R17]   . Asperger's syndrome [F84.5] 11/24/2014  . ADHD (attention deficit hyperactivity disorder) [F90.9] 11/24/2014  . Generalized anxiety disorder [F41.1] 11/24/2014  . Depressive disorder [F32.9] 11/24/2014   Total Time spent with patient: 30 minutes   Past Medical History:  Past Medical History  Diagnosis Date  . Asperger's disorder   . Scarlet fever   . H/O scarlet fever     Past Surgical History  Procedure Laterality Date  . Hernia repair     Family History:  Family History  Problem Relation Age of Onset  . Alcohol abuse Other    Social History:  History  Alcohol Use No     History  Drug Use No    History   Social History  . Marital Status: Single    Spouse Name: N/A  . Number of Children: N/A  . Years of Education: N/A   Social History Main Topics  . Smoking status: Never Smoker   . Smokeless tobacco: Never Used  . Alcohol Use: No  . Drug Use: No  . Sexual Activity: No   Other Topics Concern  . None   Social History Narrative   Additional History:    Sleep: good Appetite: good   Musculoskeletal: Strength & Muscle Tone: within normal limits Gait &  Station: normal Patient leans: N/A   Psychiatric Specialty Exam: Physical Exam  Constitutional: He is oriented to person, place, and time. He appears well-developed and well-nourished.  HENT:  Head: Normocephalic and atraumatic.  Neck: Normal range of motion. Neck supple.  Genitourinary: Prostate normal.  Musculoskeletal: Normal range of motion.  Neurological: He is alert and oriented to person, place, and time.  Skin: Skin is warm and dry.    ROS  Blood pressure 140/82, pulse 111, temperature 98.2 F (36.8 C), temperature source Oral, resp. rate 17, height 6' (1.829 m), weight 56.7 kg (125 lb), SpO2 100 %.Body mass index is 16.95 kg/(m^2).  General Appearance: casual  Eye Contact::  good  Speech:  Slow, patient takes a while to answer questions , but answers appropriately   Volume:  Normal  Mood:  Flat and depressed (may be baseline)  Affect:  flat  Thought Process:  Linear and but response is very slow.  Orientation:  Full (Time, Place, and Person)  Thought Content:  Goal directed  Suicidal Thoughts:  No  Homicidal Thoughts:  No  Memory:  Immediate;   Fair Recent;   Fair Remote;   Fair  Judgement:  Fair  Insight:  Fair  Psychomotor Activity:  Decreased  Concentration:  Fair  Recall:  [employer] of Knowledge:Fair  Language: Fair  Akathisia:  No  Handed:  Right  AIMS (if indicated):  Assets:  Social Support  ADL's:  Intact  Cognition: WNL  Sleep:  Number of Hours: 5.5     Current Medications: Current Facility-Administered Medications  Medication Dose Route Frequency Provider Last Rate Last Dose  . acetaminophen (TYLENOL) tablet 650 mg  650 mg Oral Q6H PRN Kerry HoughSpencer E Simon, PA-C      . alum & mag hydroxide-simeth (MAALOX/MYLANTA) 200-200-20 MG/5ML suspension 30 mL  30 mL Oral Q4H PRN Kerry HoughSpencer E Simon, PA-C      . citalopram (CELEXA) tablet 20 mg  20 mg Oral Daily Saramma Eappen, MD   20 mg at 11/28/14 0800  . hydrOXYzine (ATARAX/VISTARIL) tablet 25 mg  25 mg  Oral TID Jomarie LongsSaramma Eappen, MD   25 mg at 11/28/14 1202  . lisdexamfetamine (VYVANSE) capsule 30 mg  30 mg Oral Daily Kerry HoughSpencer E Simon, PA-C   30 mg at 11/28/14 0801  . magnesium hydroxide (MILK OF MAGNESIA) suspension 30 mL  30 mL Oral Daily PRN Kerry HoughSpencer E Simon, PA-C   30 mL at 11/24/14 0820  . traZODone (DESYREL) tablet 50 mg  50 mg Oral QHS,MR X 1 Kerry HoughSpencer E Simon, PA-C   50 mg at 11/25/14 2202    Lab Results:  No results found for this or any previous visit (from the past 48 hour(s)).  Physical Findings: AIMS: Facial and Oral Movements Muscles of Facial Expression: None, normal Lips and Perioral Area: None, normal Jaw: None, normal Tongue: None, normal,Extremity Movements Upper (arms, wrists, hands, fingers): None, normal Lower (legs, knees, ankles, toes): None, normal, Trunk Movements Neck, shoulders, hips: None, normal, Overall Severity Severity of abnormal movements (highest score from questions above): None, normal Incapacitation due to abnormal movements: None, normal Patient's awareness of abnormal movements (rate only patient's report): No Awareness, Dental Status Current problems with teeth and/or dentures?: No Does patient usually wear dentures?: No  CIWA:  CIWA-Ar Total: 0 COWS:     Assessment: Patient with anxiety,depression ADHD as well as Asperger's presents after losing his job 10 days ago. Patient continues to improve.    Treatment Plan Summary: Daily contact with patient to assess and evaluate symptoms and progress in treatment and Medication management Will continue Vyvanse as prescribed by outpatient provider. Patient does not want any changes with dosage. Will continue Celexa  20 mg po daily  Will continue Vistaril 25 mg po tid  Will continue to monitor vitals ,medication compliance and treatment side effects while patient is here. Denies side effects Will monitor for medical issues as well as call consult as needed.   Abnormal CMP - bilirubin was elevated -  -denies  abdominal pain or tenderness CSW will start working on disposition.  Patient to participating in groups and finding it helpful     Medical Decision Making:  Review of Psycho-Social Stressors (1), Review or order clinical lab tests (1), Review or order medicine tests (1), Review of Medication Regimen & Side Effects (2) and Review of New Medication or Change in Dosage (2)  -denies side effects   LARACH, MARY  PMHNP 11/28/2014, 2:20 PM I agreed with the findings, treatment and disposition plan of this patient. Kathryne SharperSyed Arfeen, MD

## 2014-11-28 NOTE — [facility] Group Notes (Signed)
[facility] Group Notes:  (Clinical Social Work)  11/28/2014   1:15-2:15PM  Summary of Progress/Problems:  The main focus of today's process group was to   identify the patient's current support system and decide on other supports that can be put in place.  The picture on workbook was used to discuss why additional supports are needed.  An emphasis was placed on using counselor, doctor, therapy groups, 12-step groups, and problem-specific support groups to expand supports.   There was also an extensive discussion about what constitutes a healthy support versus an unhealthy support.  The patient expressed full comprehension of the concepts presented.  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  He had tics throughout group, appeared to be inattentive most of the time.  Type of Therapy:  Process Group  Participation Level:  Active  Participation Quality:  Inattentive and Sharing  Affect:  Blunted  Cognitive:  Appropriate  Insight:  Improving  Engagement in Therapy:  Limited  Modes of Intervention:  Education,  Support and Processing  Ambrose MantleMareida Grossman-Orr, LCSW 11/28/2014, 4:00pm

## 2014-11-28 NOTE — Progress Notes (Signed)
D.  Pt pleasant on approach, denies complaints at this time.  Mother called and spoke with day shift RN and stated that she must be called in the AM if Pt is going to be discharged tomorrow so that she can make plans to come and pick him up in the afternoon, due to her job.  Pt denies SI/HI/hallucinations at this time.  Interacting appropriately this evening with peers on the unit.  Positive for evening wrap up group.  A.  Support and encouragement offered  R.  Pt remains safe on unit, will continue to monitor.

## 2014-11-28 NOTE — Progress Notes (Signed)
Psychoeducational Group Note  Psychoeducational Group Note  Date: 11/28/2014 Time: 1000 Group Topic/Focus:  Gratefulness:  The focus of this group is to help patients identify what two things they are most grateful for in their lives. What helps ground them and to center them on their work to their recovery.  Participation Level:  Active  Participation Quality:  Attentive  Affect:  Flat  Cognitive:  Oriented  Insight:  Improving  Engagement in Group:  Engaged  Additional Comments:  Pt partisipated and was involved when others shared their ideas  Dione HousekeeperJudge, Yaqueline Gutter A

## 2014-11-28 NOTE — Progress Notes (Signed)
D) Pt had a nose bleed this morning and handled it well. Went to staff and remained calm throughout the nose bleed episode. States "the air in here is very dry and that's what happens when my nose gets dry". Pt was very matter of fact about. Pt rates his depression at a 3, hopelessness at a 0 and his anxiety at a 3. Denies SI and HI. A) Given support, reassurance and praise. Encouragement given. Pt given an ice pack and staff standing with Pt uniti his nose stopped bleeding. Provided with a 1:1 R) Pt denies SI and HI.

## 2014-11-29 DIAGNOSIS — F908 Attention-deficit hyperactivity disorder, other type: Secondary | ICD-10-CM | POA: Insufficient documentation

## 2014-11-29 MED ORDER — CITALOPRAM HYDROBROMIDE 20 MG PO TABS: 20.0000 mg | ORAL_TABLET | Freq: Every day | ORAL | Status: DC

## 2014-11-29 MED ORDER — HYDROXYZINE HCL 25 MG PO TABS: 25.0000 mg | ORAL_TABLET | Freq: Three times a day (TID) | ORAL | Status: DC

## 2014-11-29 MED ORDER — LISDEXAMFETAMINE DIMESYLATE 30 MG PO CAPS: 30.0000 mg | ORAL_CAPSULE | Freq: Every day | ORAL | Status: DC

## 2014-11-29 MED ORDER — TRAZODONE HCL 50 MG PO TABS: 50.0000 mg | ORAL_TABLET | Freq: Every evening | ORAL | Status: DC | PRN

## 2014-11-29 NOTE — [facility] Group Notes (Signed)
   [facility]  11/29/2014  8:45 AM   Participation Quality: Alert, Appropriate and Oriented  Mood/Affect: Appropriate   Depression Rating: 0  Anxiety Rating: 0  Thoughts of Suicide: Pt denies SI/HI  Will you contract for safety? Yes  Current AVH: Pt denies  Plan for Discharge/Comments: Pt attended discharge planning group and actively participated in group. CSW provided pt with today's workbook. Patient plans to return home at discharge to follow up with [facility] and Estill BakesVickie Connor for outpatient services.   Transportation Means: Pt reports access to transportation  Supports: No supports mentioned at this time  Samuella BruinKristin Ric Rosenberg, MSW, [employer]CSWA Clinical Social Worker [facility] [phone]

## 2014-11-29 NOTE — Plan of Care (Signed)
Problem: [facility] Participation in Recreation Therapeutic Interventions Goal: STG-Patient will verbalize understanding/application of at l STG: Anxiety - Patient will verbalize understanding and application of at least 2 stress management techniques to be used post discharge by conclusion of recreation therapy tx.  Outcome: Completed/Met Date Met:  11/29/14 02.29.2016 Patient educated on 2 stress management techniques during admission, expressing understanding of both. Fernando Todd, LRT/CTRS

## 2014-11-29 NOTE — Progress Notes (Signed)
Recreation Therapy Notes  INPATIENT RECREATION TR PLAN  Patient Details Name: Erma Joubert MRN: [id number] DOB: September 02, 1991 Today's Date: 11/29/2014  Rec Therapy Plan Is patient appropriate for Therapeutic Recreation?: Yes Treatment times per week: x1 Estimated Length of Stay: 5-7 days TR Treatment/Interventions: 1:1 session  Discharge Criteria Pt will be discharged from therapy if:: Discharged Treatment plan/goals/alternatives discussed and agreed upon by:: Patient/family  Discharge Summary Short term goals set: Patient will verbalize understanding and application of at least 2 stress management techniques to be used post discharge by conclusion of recreation therapy tx.  Short term goals met: Complete Progress toward goals comments: One-to-one attended One-to-one attended: Stress Management  Reason goals not met: N/A Therapeutic equipment acquired: None Reason patient discharged from therapy: Discharge from hospital Pt/family agrees with progress & goals achieved: Yes Date patient discharged from therapy: 11/29/14   Lane Hacker, LRT/CTRS 11/29/2014, 2:17 PM

## 2014-11-29 NOTE — Discharge Summary (Signed)
Physician Discharge Summary Note  Patient:  Fernando Todd is an 24 y.o., male MRN:  [id number] DOB:  1990-10-05 Patient phone:  [phone] (home)  Patient address:   [address] [place] [address],  Total Time spent with patient: 30 minutes  Date of Admission:  11/23/2014 Date of Discharge: 11/29/14  Reason for Admission:  Mood stabilization treatments   Principal Problem: Generalized anxiety disorder Discharge Diagnoses: Patient Active Problem List   Diagnosis Date Noted  . Elevated bilirubin [R17]   . Asperger's syndrome [F84.5] 11/24/2014  . ADHD (attention deficit hyperactivity disorder) [F90.9] 11/24/2014  . Generalized anxiety disorder [F41.1] 11/24/2014  . Depressive disorder [F32.9] 11/24/2014   Musculoskeletal: Strength & Muscle Tone: within normal limits Gait & Station: normal Patient leans: N/A  Psychiatric Specialty Exam: Physical Exam  Psychiatric: He has a normal mood and affect. His speech is normal and behavior is normal. Judgment and thought content normal. Cognition and memory are normal.    Review of Systems  Constitutional: Negative.   HENT: Negative.   Eyes: Negative.   Respiratory: Negative.   Cardiovascular: Negative.   Gastrointestinal: Negative.   Genitourinary: Negative.   Musculoskeletal: Negative.   Skin: Negative.   Neurological: Negative.   Endo/Heme/Allergies: Negative.   Psychiatric/Behavioral: Negative for depression, suicidal ideas, hallucinations, memory loss and substance abuse. The patient is not nervous/anxious and does not have insomnia.     Blood pressure 116/71, pulse 79, temperature 97.7 F (36.5 C), temperature source Oral, resp. rate 16, height 6' (1.829 m), weight 56.7 kg (125 lb), SpO2 100 %.Body mass index is 16.95 kg/(m^2).  See Physician SRA     Past Medical History:  Past Medical History  Diagnosis Date  . Asperger's disorder   . Scarlet fever   . H/O scarlet fever     Past Surgical History   Procedure Laterality Date  . Hernia repair     Family History:  Family History  Problem Relation Age of Onset  . Alcohol abuse Other    Social History:  History  Alcohol Use No     History  Drug Use No    History   Social History  . Marital Status: Single    Spouse Name: N/A  . Number of Children: N/A  . Years of Education: N/A   Social History Main Topics  . Smoking status: Never Smoker   . Smokeless tobacco: Never Used  . Alcohol Use: No  . Drug Use: No  . Sexual Activity: No   Other Topics Concern  . None   Social History Narrative   Risk to Self: Is patient at risk for suicide?: Yes What has been your use of drugs/alcohol within the last 12 months?: None Risk to Others:   Prior Inpatient Therapy:   Prior Outpatient Therapy:    Level of Care:  OP  Hospital Course:  Fernando Todd is a 68 y old CM who is unemployed ,lives with his parents , has a previous hx of Asperger's as well as ADHD . Patient was recently started on Celexa for depression as well as anxiety sx. Patient started seeing [name] for psychiatry in January. Patient presented with worsening depression as well as SI. Patient with several psychosocial stressors like job loss 10 days ago. Patient reports being fired since he was not able to focus . Patient was working a a Designer, industrial/product for [employer]. Patient also reports relational struggles between his parents with whom he lives and this is another  stressor for him.         Fernando Todd was admitted to the adult unit. He was evaluated and his symptoms were identified. Medication management was discussed and initiated. His Celexa was increased to 20 mg daily for depression. His Vyvanse was continued at 30 mg daily for treatment of ADHD. He was oriented to the unit and encouraged to participate in unit programming. Medical problems were identified and treated appropriately. Home medication was restarted as needed.        The patient was  evaluated each day by a clinical provider to ascertain the patient's response to treatment.  Improvement was noted by the patient's report of decreasing symptoms, improved sleep and appetite, affect, medication tolerance, behavior, and participation in unit programming.  He was asked each day to complete a self inventory noting mood, mental status, pain, new symptoms, anxiety and concerns.         He responded well to medication and being in a therapeutic and supportive environment. Positive and appropriate behavior was noted and the patient was motivated for recovery.  The patient worked closely with the treatment team and case manager to develop a discharge plan with appropriate goals. Coping skills, problem solving as well as relaxation therapies were also part of the unit programming. Patient reported getting along well with peers on the unit.          By the day of discharge he was in much improved condition than upon admission.  Symptoms were reported as significantly decreased or resolved completely. The patient denied SI/HI and voiced no AVH. He was motivated to continue taking medication with a goal of continued improvement in mental health.  Fernando Todd was discharged home with a plan to follow up as noted below. The patient was provided with sample medications and prescriptions at time of discharge. He left [facility] in stable condition with all belongings returned to him. The patient was picked up by his mother to return home.   Consults:  psychiatry  Significant Diagnostic Studies:  Chemistry panel, CBC, TSH, Hepatitis panel negative, UDS positive for amphetamines   Discharge Vitals:   Blood pressure 116/71, pulse 79, temperature 97.7 F (36.5 C), temperature source Oral, resp. rate 16, height 6' (1.829 m), weight 56.7 kg (125 lb), SpO2 100 %. Body mass index is 16.95 kg/(m^2). Lab Results:   No results found for this or any previous visit (from the past 72 hour(s)).  Physical Findings: AIMS:  Facial and Oral Movements Muscles of Facial Expression: None, normal Lips and Perioral Area: None, normal Jaw: None, normal Tongue: None, normal,Extremity Movements Upper (arms, wrists, hands, fingers): None, normal Lower (legs, knees, ankles, toes): None, normal, Trunk Movements Neck, shoulders, hips: None, normal, Overall Severity Severity of abnormal movements (highest score from questions above): None, normal Incapacitation due to abnormal movements: None, normal Patient's awareness of abnormal movements (rate only patient's report): No Awareness, Dental Status Current problems with teeth and/or dentures?: No Does patient usually wear dentures?: No  CIWA:  CIWA-Ar Total: 0 COWS:      See Psychiatric Specialty Exam and Suicide Risk Assessment completed by Attending Physician prior to discharge.  Discharge destination:  Home  Is patient on multiple antipsychotic therapies at discharge:  No   Has Patient had three or more failed trials of antipsychotic monotherapy by history:  No  Recommended Plan for Multiple Antipsychotic Therapies: NA     Medication List    STOP taking these medications  lidocaine 2 % solution  Commonly known as:  XYLOCAINE     meloxicam 15 MG tablet  Commonly known as:  MOBIC      TAKE these medications      Indication   citalopram 20 MG tablet  Commonly known as:  CELEXA  Take 1 tablet (20 mg total) by mouth daily.   Indication:  Anxiety     hydrOXYzine 25 MG tablet  Commonly known as:  ATARAX/VISTARIL  Take 1 tablet (25 mg total) by mouth 3 (three) times daily.   Indication:  Anxiety Neurosis     lisdexamfetamine 30 MG capsule  Commonly known as:  VYVANSE  Take 1 capsule (30 mg total) by mouth daily.   Indication:  Attention Deficit Hyperactivity Disorder     traZODone 50 MG tablet  Commonly known as:  DESYREL  Take 1 tablet (50 mg total) by mouth at bedtime and may repeat dose one time if needed.   Indication:  Trouble  Sleeping           Follow-up Information    Follow up with Johnell ComingsLisa Poulous - [facility] On 12/21/2014.   Why:  You are scheduled with Johnell ComingsLisa Poulous on Tuesday, December 21, 2014 at 11:15   Contact information:   [address] [place], [place]   [address]  [phone]      Follow up with Vickie Conners, Counselor On 11/30/2014.   Why:  You are scheduled with Vickie Conners on Tuesday, November 30, 2014 at 5:15 PM   Contact information:   [address] [place], [place]   [address]  [phone]      Follow-up recommendations:   Activity: NO RESTRICTIONS Diet: REGULAR Tests: AS NEEDED Other: FOLLOW UP WITH AFTER CARE AS NEEDED  Comments:   Take all your medications as prescribed by your mental healthcare provider.  Report any adverse effects and or reactions from your medicines to your outpatient provider promptly.  Patient is instructed and cautioned to not engage in alcohol and or illegal drug use while on prescription medicines.  In the event of worsening symptoms, patient is instructed to call the crisis hotline, 911 and or go to the nearest ED for appropriate evaluation and treatment of symptoms.  Follow-up with your primary care provider for your other medical issues, concerns and or health care needs.   Total Discharge Time: Greater than 30 minutes   Signed: Fatime Biswell NP-C 11/29/2014, 10:22 AM

## 2014-11-29 NOTE — Progress Notes (Signed)
Adult Psychoeducational Group Note  Date:  11/29/2014 Time:  12:29 AM  Group Topic/Focus:  Wrap-Up Group:   The focus of this group is to help patients review their daily goal of treatment and discuss progress on daily workbooks.  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 After discharge, pt plans to work on not isolating himself.  Malachy MoanJeffers, Tabbitha Janvrin S 11/29/2014, 12:29 AM

## 2014-11-29 NOTE — Progress Notes (Signed)
Patient ID: Fernando Todd, male   DOB: Mar 19, 1991, 24 y.o.   MRN: [id number] Late entry for his discharge at 12:15:  He was discharged home and was picked up by his mother. Pt. Voiced understanding [id number] discharge teaching about medications and follow up care. He Denies SI thoughts and all his belongings were take belongings on unit were taken home with him.

## 2014-11-29 NOTE — Progress Notes (Signed)
  [facility] :  Will you be returning to the same living situation after discharge:  Yes,  patient plans to return home at discharge At discharge, do you have transportation home?: Yes,  patient reports access to transportation Do you have the ability to pay for your medications: Yes,  patient will be provided with prescriptions at discharge  Release of information consent forms completed and in the chart;  Patient's signature needed at discharge.  Patient to Follow up at: Follow-up Information    Follow up with Johnell ComingsLisa Poulous - [facility] On 12/21/2014.   Why:  You are scheduled with Johnell ComingsLisa Poulous on Tuesday, December 21, 2014 at 11:15   Contact information:   [address] [place], [place]   [address]  [phone]      Follow up with Vickie Conners, Counselor On 11/30/2014.   Why:  You are scheduled with Vickie Conners on Tuesday, November 30, 2014 at 5:15 PM   Contact information:   [address] [place], [place]   [address]  [phone]      Patient denies SI/HI: Yes,  denies    Safety Planning and Suicide Prevention discussed: Yes, with patient and father  Have you used any form of tobacco in the last 30 days? (Cigarettes, Smokeless Tobacco, Cigars, and/or Pipes): No  Has patient been referred to the Quitline?: N/A patient is not a smoker  Delyle Weider, [place] CarboKristin L 11/29/2014, 12:56 PM

## 2014-11-29 NOTE — [facility] Suicide Risk Assessment (Signed)
[facility] Discharge Suicide Risk Assessment   Demographic Factors:  Male and Caucasian  Total Time spent with patient: 30 minutes  Musculoskeletal: Strength & Muscle Tone: within normal limits Gait & Station: normal Patient leans: N/A  Psychiatric Specialty Exam: Physical Exam  Review of Systems  Psychiatric/Behavioral: Negative for depression, suicidal ideas, hallucinations and substance abuse.    Blood pressure 116/71, pulse 79, temperature 97.7 F (36.5 C), temperature source Oral, resp. rate 16, height 6' (1.829 m), weight 56.7 kg (125 lb), SpO2 100 %.Body mass index is 16.95 kg/(m^2).  General Appearance: Fairly Groomed  Patent attorney::  Fair  Speech:  Clear and Coherent409  Volume:  Normal  Mood:  Euthymic  Affect:  Appropriate  Thought Process:  Coherent  Orientation:  Full (Time, Place, and Person)  Thought Content:  WDL  Suicidal Thoughts:  No  Homicidal Thoughts:  No  Memory:  Immediate;   Fair Recent;   Fair Remote;   Fair  Judgement:  Fair  Insight:  Fair  Psychomotor Activity:  Normal  Concentration:  Fair  Recall:  [employer] of Knowledge:Fair  Language: Fair  Akathisia:  No  Handed:  Right  AIMS (if indicated):     Assets:  Communication Skills Desire for Improvement  Sleep:  Number of Hours: 6  Cognition: WNL  ADL's:  Intact   Have you used any form of tobacco in the last 30 days? (Cigarettes, Smokeless Tobacco, Cigars, and/or Pipes): No  Has this patient used any form of tobacco in the last 30 days? (Cigarettes, Smokeless Tobacco, Cigars, and/or Pipes) No  Mental Status Per Nursing Assessment::   On Admission:  NA (SI prior to admission)  Current Mental Status by Physician: patient with Asperger's - is slow to respond at baseline with regards to speech. Patient denies any anxiety.depression as well as SI/HI/AH/VH.  Loss Factors: NA  Historical Factors: Impulsivity  Risk Reduction Factors:   Living with another person, especially a relative  and Positive social support  Continued Clinical Symptoms:  Previous Psychiatric Diagnoses and Treatments  Cognitive Features That Contribute To Risk:  Polarized thinking    Suicide Risk:  Minimal: No identifiable suicidal ideation.  Patients presenting with no risk factors but with morbid ruminations; may be classified as minimal risk based on the severity of the depressive symptoms  Principal Problem: Generalized anxiety disorder (improved) Discharge Diagnoses:  Primary Psychiatric Diagnosis: Generalized anxiety disorder   Secondary Psychiatric Diagnosis: Asperger's syndrome ADHD  Non Psychiatric Diagnosis: SEE PMH    Patient Active Problem List   Diagnosis Date Noted  . Elevated bilirubin [R17]   . Asperger's syndrome [F84.5] 11/24/2014  . ADHD (attention deficit hyperactivity disorder) [F90.9] 11/24/2014  . Generalized anxiety disorder [F41.1] 11/24/2014  . Depressive disorder [F32.9] 11/24/2014    Follow-up Information    Follow up with Johnell Comings - [facility] On 12/21/2014.   Why:  You are scheduled with Johnell Comings on Tuesday, December 21, 2014 at 11:15   Contact information:   [address] [place], [place]   [address]  [phone]      Follow up with Vickie Conners, Counselor On 11/30/2014.   Why:  You are scheduled with Vickie Conners on Tuesday, November 30, 2014 at 5:15 PM   Contact information:   [address] [place], [place]   [address]  [phone]      Plan Of Care/Follow-up recommendations:  Activity:  NO RESTRICTIONS Diet:  REGULAR Tests:  AS NEEDED Other:  FOLLOW UP WITH  AFTER CARE AS NEEDED  Is patient on multiple antipsychotic therapies at discharge:  No   Has Patient had three or more failed trials of antipsychotic monotherapy by history:  No  Recommended Plan for Multiple Antipsychotic Therapies: NA    Nikolaus Pienta md 11/29/2014, 10:35 AM

## 2014-12-02 NOTE — Progress Notes (Signed)
Patient Discharge Instructions:  After Visit Summary (AVS):   Faxed to:  12/02/14 Discharge Summary Note:   Faxed to:  12/02/14 Psychiatric Admission Assessment Note:   Faxed to:  12/02/14 Suicide Risk Assessment - Discharge Assessment:   Faxed to:  12/02/14 Faxed/Sent to the Next Level Care provider:  12/02/14 Faxed to Christophe LouisVicki Conner @[phone] Faxed to [facility] @ [phone]  [name] , 12/02/2014, 2:39 PM

## 2019-12-04 ENCOUNTER — Ambulatory Visit: Payer: Self-pay | Attending: Internal Medicine

## 2019-12-04 ENCOUNTER — Ambulatory Visit: Payer: Self-pay

## 2019-12-04 DIAGNOSIS — Z23 Encounter for immunization: Secondary | ICD-10-CM

## 2019-12-04 NOTE — Progress Notes (Signed)
   Covid-19 Vaccination Clinic  Name:  Fernando Todd    MRN: [id number] DOB: 04-13-1991  12/04/2019  [name] was observed post Covid-19 immunization for 15 minutes without incident. He was provided with Vaccine Information Sheet and instruction to access the V-Safe system.   [name] was instructed to call 911 with any severe reactions post vaccine: Marland Kitchen Difficulty breathing  . Swelling of face and throat  . A fast heartbeat  . A bad rash all over body  . Dizziness and weakness

## 2020-01-05 ENCOUNTER — Ambulatory Visit: Payer: Self-pay

## 2020-01-07 ENCOUNTER — Ambulatory Visit: Payer: Medicaid Other | Attending: Internal Medicine

## 2020-01-07 DIAGNOSIS — Z23 Encounter for immunization: Secondary | ICD-10-CM

## 2020-01-07 NOTE — Progress Notes (Signed)
   Covid-19 Vaccination Clinic  Name:  Fernando Todd    MRN: [id number] DOB: Jul 06, 1991  01/07/2020  Fernando Todd was observed post Covid-19 immunization for 15 minutes without incident. He was provided with Vaccine Information Sheet and instruction to access the V-Safe system.   Fernando Todd was instructed to call 911 with any severe reactions post vaccine: Marland Kitchen Difficulty breathing  . Swelling of face and throat  . A fast heartbeat  . A bad rash all over body  . Dizziness and weakness   Immunizations Administered    Name Date Dose VIS Date Route   Pfizer COVID-19 Vaccine 01/07/2020 10:07 AM 0.3 mL 09/11/2019 Intramuscular   Manufacturer: [employer], [employer]   Lot: [id number]   NDC: [address]-6

## 2024-03-16 ENCOUNTER — Encounter (HOSPITAL_BASED_OUTPATIENT_CLINIC_OR_DEPARTMENT_OTHER): Payer: Self-pay

## 2024-03-16 ENCOUNTER — Other Ambulatory Visit: Payer: Self-pay

## 2024-03-23 ENCOUNTER — Ambulatory Visit (HOSPITAL_BASED_OUTPATIENT_CLINIC_OR_DEPARTMENT_OTHER): Admission: RE | Admit: 2024-03-23 | Source: Home / Self Care

## 2024-03-23 HISTORY — DX: Depression, unspecified: F32.A

## 2024-03-23 HISTORY — DX: Anxiety disorder, unspecified: F41.9

## 2024-03-23 SURGERY — EXCISION, MASS, HEAD
Anesthesia: Monitor Anesthesia Care | Site: Neck
